# Patient Record
Sex: Female | Born: 1990 | Race: Black or African American | Hispanic: Yes | Marital: Single | Smoking: Never smoker
Health system: Southern US, Community
[De-identification: ages and names within clinical notes are randomized; demographics above are authoritative.]

## PROBLEM LIST (undated history)

## (undated) DIAGNOSIS — E119 Type 2 diabetes mellitus without complications: Secondary | ICD-10-CM

## (undated) DIAGNOSIS — B009 Herpesviral infection, unspecified: Secondary | ICD-10-CM

## (undated) DIAGNOSIS — L309 Dermatitis, unspecified: Secondary | ICD-10-CM

## (undated) DIAGNOSIS — T7840XA Allergy, unspecified, initial encounter: Secondary | ICD-10-CM

## (undated) DIAGNOSIS — R87629 Unspecified abnormal cytological findings in specimens from vagina: Secondary | ICD-10-CM

## (undated) DIAGNOSIS — O24419 Gestational diabetes mellitus in pregnancy, unspecified control: Secondary | ICD-10-CM

## (undated) DIAGNOSIS — D649 Anemia, unspecified: Secondary | ICD-10-CM

## (undated) DIAGNOSIS — R519 Headache, unspecified: Secondary | ICD-10-CM

## (undated) DIAGNOSIS — Z789 Other specified health status: Secondary | ICD-10-CM

## (undated) HISTORY — DX: Headache, unspecified: R51.9

## (undated) HISTORY — PX: DENTAL SURGERY: SHX609

## (undated) HISTORY — DX: Allergy, unspecified, initial encounter: T78.40XA

## (undated) HISTORY — DX: Dermatitis, unspecified: L30.9

## (undated) HISTORY — DX: Gestational diabetes mellitus in pregnancy, unspecified control: O24.419

## (undated) HISTORY — DX: Herpesviral infection, unspecified: B00.9

---

## 2017-04-20 ENCOUNTER — Ambulatory Visit (INDEPENDENT_AMBULATORY_CARE_PROVIDER_SITE_OTHER): Payer: Commercial Managed Care - HMO | Admitting: Physician Assistant

## 2017-04-20 ENCOUNTER — Ambulatory Visit (INDEPENDENT_AMBULATORY_CARE_PROVIDER_SITE_OTHER): Payer: Commercial Managed Care - HMO

## 2017-04-20 ENCOUNTER — Encounter: Payer: Self-pay | Admitting: Physician Assistant

## 2017-04-20 VITALS — BP 106/69 | HR 72 | Temp 98.1°F | Resp 18 | Ht 60.5 in | Wt 171.2 lb

## 2017-04-20 DIAGNOSIS — R202 Paresthesia of skin: Secondary | ICD-10-CM

## 2017-04-20 DIAGNOSIS — R531 Weakness: Secondary | ICD-10-CM | POA: Diagnosis not present

## 2017-04-20 DIAGNOSIS — M79602 Pain in left arm: Secondary | ICD-10-CM | POA: Diagnosis not present

## 2017-04-20 MED ORDER — MELOXICAM 15 MG PO TABS: 15.0000 mg | ORAL_TABLET | Freq: Every day | ORAL | 0 refills | Status: DC

## 2017-04-20 MED ORDER — CYCLOBENZAPRINE HCL 10 MG PO TABS: 5.0000 mg | ORAL_TABLET | Freq: Every day | ORAL | 0 refills | Status: DC

## 2017-04-20 NOTE — Progress Notes (Signed)
Patient ID: Tina Thornton, female     DOB: [birth year], 26 y.o.    MRN: [id number]  PCP: Patient, No Pcp Per  Chief Complaint  Patient presents with  . Arm Pain    Left arm injury, x 1week, [facility]    Subjective:   This patient is new to this practice and presents for evaluation of LEFT arm pain.  RIGHT hand dominant.  One week ago she was "pulling something." An empty metal can (usually contains mail), bumped the elbow repeatedly against another can. "I'm a very clumsy person." Initially it wasn't painful, but over the course of several hours, she developed a pain in the forearm (points to dorsal mid-shaft of the radius). Did not take anything to alleviate the symptoms.  Awoke on Saturday and the LEFT hand was swollen. Went to [facility] and was advised to ice it and prescribed naproxen. She requested an ACE wrap, which was placed on the forearm. The medication isn't working. [name] concerned that no radiographs were taken and that she may have a broken bone.  No previous injury to this arm. Hand remains mildly swollen.   Review of Systems As above.  Prior to Admission medications   Medication Sig Start Date End Date Taking? Authorizing Provider  naproxen (NAPROSYN) 500 MG tablet TK 1 T PO  BID 04/15/17   [provider]     No Known Allergies   There are no active problems to display for this patient.    No family history on file.   Social History   Social History  . Marital status: Single    Spouse name: n/a  . Number of children: 0  . Years of education: Bachelor's Degree   Occupational History  . Mail Tina Thornton     [employer]   Social History Main Topics  . Smoking status: Never Smoker  . Smokeless tobacco: Never Used     Comment: Hooka  . Alcohol use 0.0 oz/week  . Drug use: No  . Sexual activity: Yes    Partners: Male    Birth control/ protection: Pill     Comment: inconsistent condom use   Other Topics Concern  .  Not on file   Social History Narrative   Undergraduate degree in Psychology from [employer].   Lives alone.   Mother lives nearby.   One sister lives in [place], [place].   Father, brother and youngest sister live in [place].            Objective:  Physical Exam  Constitutional: [name] oriented to person, place, and time. She appears well-developed and well-nourished. [name] active and cooperative. No distress.  BP 106/69   Pulse 72   Temp 98.1 F (36.7 C) (Oral)   Resp 18   Ht 5' 0.5" (1.537 m)   Wt 171 lb 3.2 oz (77.7 kg)   LMP 03/25/2017   SpO2 98%   BMI 32.88 kg/m    Eyes: Conjunctivae are normal.  Pulmonary/Chest: Effort normal.  Musculoskeletal:       Left shoulder: Normal.       Left elbow: Normal.       Left wrist: Normal.       Cervical back: Normal.       Left upper arm: Normal.       Left forearm: She exhibits tenderness. She exhibits no bony tenderness, no swelling, no edema, no deformity and no laceration.  Arms:      Left hand: Decreased strength (can provide only minimal grasp on strength testing) noted.  Movement of the fingers and wrist causes pain into the forearm to the location the points to.  Neurological: [name] alert and oriented to person, place, and time. No sensory deficit.  Reduced strength in LEFT forearm and hand, but question effort. NEGATIVE Tinel's. POSITIVE Phalen's on LEFT.  Skin: Skin is warm, dry and intact. No lesion and no rash noted. No erythema.  Psychiatric: She has a normal mood and affect. Her speech is normal and behavior is normal.    Dg Forearm Left  Result Date: 04/20/2017 CLINICAL DATA:  Left forearm pain. EXAM: LEFT FOREARM - 2 VIEW COMPARISON:  None. FINDINGS: There is no evidence of fracture or other focal bone lesions. Soft tissues are unremarkable. IMPRESSION: Negative. Electronically Signed   By: Gerome Samavid  Williams III M.D   On: 04/20/2017 12:50      Assessment & Plan:  1. Left arm pain 2. Paresthesias in left hand 3.  Subjective weakness Unclear etiology, but most likely related to repetitive motion. Forearm muscle strain possible. Given paresthesias with Phalen's, employ wrist splint. Switch to meloxicam, which she can take QD, and at HS if it causes drowsiness. Cyclobenzaprine at HS. If not improved in 1-2 weeks, re-evaluate. No work restrictions recommended. - DG Forearm Left; Future - meloxicam (MOBIC) 15 MG tablet; Take 1 tablet (15 mg total) by mouth daily.  Dispense: 30 tablet; Refill: 0 - cyclobenzaprine (FLEXERIL) 10 MG tablet; Take 0.5-1 tablets (5-10 mg total) by mouth at bedtime.  Dispense: 15 tablet; Refill: 0 - Splint wrist   Return if symptoms worsen or fail to improve, and for wellness visit at your convenience.   Fernande Brashelle S. Misaki Sozio, PA-C Primary Care at [facility]

## 2017-04-20 NOTE — Patient Instructions (Addendum)
The radiographs are normal. You may have irritated the ulnar nerve by hitting the elbow repeatedly, but given that the elbow doesn't hurt, that is unlikely. You may have carpal tunnel syndrome. Wear the wrist splint as much as you can, though remove it for bathing. If it is problematic at work, at least wear it to bed. STOP the naproxen. Take meloxicam instead. Take the cyclobenzaprine (Flexeril) at bedtime, it causes sleepiness.    IF you received an x-ray today, you will receive an invoice from [facility]. Please contact [facility] at [phone] with questions or concerns regarding your invoice.   IF you received labwork today, you will receive an invoice from [facility]. Please contact [facility] at [phone] with questions or concerns regarding your invoice.   Our billing staff will not be able to assist you with questions regarding bills from these companies.  You will be contacted with the lab results as soon as they are available. The fastest way to get your results is to activate your My Chart account. Instructions are located on the last page of this paperwork. If you have not heard from us regarding the results in 2 weeks, please contact this office.     Carpal Tunnel Syndrome Carpal tunnel syndrome is a condition that causes pain in your hand and arm. The carpal tunnel is a narrow area that is on the palm side of your wrist. Repeated wrist motion or certain diseases may cause swelling in the tunnel. This swelling can pinch the main nerve in the wrist (median nerve). Follow these instructions at home: If you have a splint:   Wear it as told by your doctor. Remove it only as told by your doctor.  Loosen the splint if your fingers:  Become numb and tingle.  Turn blue and cold.  Keep the splint clean and dry. General instructions   Take over-the-counter and prescription medicines only as told by your doctor.  Rest your wrist from any activity that may  be causing your pain. If needed, talk to your employer about changes that can be made in your work, such as getting a wrist pad to use while typing.  If directed, apply ice to the painful area:  Put ice in a plastic bag.  Place a towel between your skin and the bag.  Leave the ice on for 20 minutes, 2-3 times per day.  Keep all follow-up visits as told by your doctor. This is important.  Do any exercises as told by your doctor, physical therapist, or occupational therapist. Contact a doctor if:  You have new symptoms.  Medicine does not help your pain.  Your symptoms get worse. This information is not intended to replace advice given to you by your health care provider. Make sure you discuss any questions you have with your health care provider. Document Released: 11/03/2011 Document Revised: 04/21/2016 Document Reviewed: 04/01/2015 Elsevier Interactive Patient Education  2017 [employer]

## 2017-06-25 ENCOUNTER — Emergency Department (HOSPITAL_COMMUNITY): Payer: Self-pay

## 2017-06-25 ENCOUNTER — Emergency Department (HOSPITAL_COMMUNITY)
Admission: EM | Admit: 2017-06-25 | Discharge: 2017-06-25 | Disposition: A | Discharge status: Home / Self Care | Payer: Self-pay | Attending: Emergency Medicine | Admitting: Emergency Medicine

## 2017-06-25 ENCOUNTER — Encounter (HOSPITAL_COMMUNITY): Payer: Self-pay | Admitting: Emergency Medicine

## 2017-06-25 DIAGNOSIS — Z79899 Other long term (current) drug therapy: Secondary | ICD-10-CM | POA: Insufficient documentation

## 2017-06-25 DIAGNOSIS — W228XXA Striking against or struck by other objects, initial encounter: Secondary | ICD-10-CM | POA: Insufficient documentation

## 2017-06-25 DIAGNOSIS — Y929 Unspecified place or not applicable: Secondary | ICD-10-CM | POA: Insufficient documentation

## 2017-06-25 DIAGNOSIS — S93401A Sprain of unspecified ligament of right ankle, initial encounter: Secondary | ICD-10-CM | POA: Insufficient documentation

## 2017-06-25 DIAGNOSIS — Y990 Civilian activity done for income or pay: Secondary | ICD-10-CM | POA: Insufficient documentation

## 2017-06-25 DIAGNOSIS — Y9389 Activity, other specified: Secondary | ICD-10-CM | POA: Insufficient documentation

## 2017-06-25 MED ORDER — IBUPROFEN 600 MG PO TABS: 600.0000 mg | ORAL_TABLET | Freq: Four times a day (QID) | ORAL | 0 refills | Status: DC | PRN

## 2017-06-25 MED ORDER — IBUPROFEN 400 MG PO TABS
800.0000 mg | ORAL_TABLET | Freq: Once | ORAL | Status: AC
  Administered 2017-06-25: 800 mg via ORAL
  Filled 2017-06-25: qty 2

## 2017-06-25 NOTE — ED Notes (Signed)
Pt verbalized understanding of d/c instructions and has no further questions. Pt is stable, A&Ox4, VSS.  

## 2017-06-25 NOTE — ED Triage Notes (Signed)
Pt st's she was pulling something at work and it hit his right ankle.  Pt c/o pain to right ankle

## 2017-06-25 NOTE — ED Notes (Signed)
Pt given ice for injury

## 2017-06-25 NOTE — ED Provider Notes (Signed)
[facility]-EMERGENCY DEPT Provider Note   CSN: [id number] Arrival date & time: 06/25/17  0011     History   Chief Complaint Chief Complaint  Patient presents with  . Ankle Injury    HPI Juanetta SnowJonahiry Flow is a 26 y.o. female.   Ankle Pain   The incident occurred 6 to 12 hours ago. The incident occurred at work. Injury mechanism: pulled heavy object, hitting ankle. The pain is present in the right ankle. The pain is mild. The pain has been constant since onset. Associated symptoms include tingling. Pertinent negatives include no inability to bear weight and no loss of sensation. The symptoms are aggravated by bearing weight. She has tried elevation for the symptoms. The treatment provided no relief.    History reviewed. No pertinent past medical history.  There are no active problems to display for this patient.   Past Surgical History:  Procedure Laterality Date  . DENTAL SURGERY      OB History    No data available       Home Medications    Prior to Admission medications   Medication Sig Start Date End Date Taking? Authorizing Provider  cyclobenzaprine (FLEXERIL) 10 MG tablet Take 0.5-1 tablets (5-10 mg total) by mouth at bedtime. 04/20/17   Porfirio OarJeffery, Chelle, PA-C  ibuprofen (ADVIL,MOTRIN) 600 MG tablet Take 1 tablet (600 mg total) by mouth every 6 (six) hours as needed. 06/25/17   Antony MaduraHumes, Latonga Ponder, PA-C  meloxicam (MOBIC) 15 MG tablet Take 1 tablet (15 mg total) by mouth daily. 04/20/17   Porfirio OarJeffery, Chelle, PA-C    Family History No family history on file.  Social History Social History  Substance Use Topics  . Smoking status: Never Smoker  . Smokeless tobacco: Never Used     Comment: Hooka  . Alcohol use 0.0 oz/week     Allergies   Patient has no known allergies.   Review of Systems Review of Systems  Musculoskeletal: Positive for arthralgias.  Neurological: Positive for tingling.  Ten systems reviewed and are negative for acute change, except as noted in the  HPI.    Physical Exam Updated Vital Signs BP 116/75 (BP Location: Right Arm)   Pulse 84   Temp 98.1 F (36.7 C) (Oral)   Resp 16   Ht 5' (1.524 m)   Wt 72.6 kg (160 lb)   LMP 06/11/2017 (Approximate)   SpO2 100%   BMI 31.25 kg/m   Physical Exam  Constitutional: [name] oriented to person, place, and time. She appears well-developed and well-nourished. No distress.  Nontoxic and in NAD  HENT:  Head: Normocephalic and atraumatic.  Eyes: Conjunctivae and EOM are normal. No scleral icterus.  Neck: Normal range of motion.  Cardiovascular: Normal rate, regular rhythm and intact distal pulses.   DP pulses 2+ in the right lower extremity  Pulmonary/Chest: Effort normal. No respiratory distress.  Musculoskeletal: Normal range of motion.  Tenderness to the lateral malleolus of the right ankle. No significant swelling. No pitting edema, deformity, or crepitus. Patient able to wiggle all toes.  Neurological: [name] alert and oriented to person, place, and time. She exhibits normal muscle tone. Coordination normal.  Sensation to light touch intact in the right lower extremity.  Skin: Skin is warm and dry. No rash noted. [name] not diaphoretic. No erythema. No pallor.  Psychiatric: She has a normal mood and affect. Her behavior is normal.  Nursing note and vitals reviewed.    ED Treatments / Results  Labs (all labs  ordered are listed, but only abnormal results are displayed) Labs Reviewed - No data to display  EKG  EKG Interpretation None       Radiology Dg Ankle Complete Right  Result Date: 06/25/2017 CLINICAL DATA:  Blunt trauma to the right ankle with persistent lateral pain EXAM: RIGHT ANKLE - COMPLETE 3+ VIEW COMPARISON:  None. FINDINGS: Negative for acute fracture or dislocation. The mortise is symmetric. Incidental plantar calcaneal spur. IMPRESSION: Negative for acute fracture Electronically Signed   By: Ellery Plunkaniel R Mitchell M.D.   On: 06/25/2017 01:34     Procedures Procedures (including critical care time)  Medications Ordered in ED Medications  ibuprofen (ADVIL,MOTRIN) tablet 800 mg (800 mg Oral Given 06/25/17 0151)     Initial Impression / Assessment and Plan / ED Course  I have reviewed the triage vital signs and the nursing notes.  Pertinent labs & imaging results that were available during my care of the patient were reviewed by me and considered in my medical decision making (see chart for details).     26 year old female presents for right ankle pain. [name] neurovascularly intact. No bony deformity or crepitus. X-ray negative for fracture or bony deformity. Symptoms consistent with ankle sprain. Will manage supportively with RICE and NSAIDs. Return precautions discussed and provided. Patient discharged in stable condition with no unaddressed concerns.   Final Clinical Impressions(s) / ED Diagnoses   Final diagnoses:  Sprain of right ankle, unspecified ligament, initial encounter    New Prescriptions New Prescriptions   IBUPROFEN (ADVIL,MOTRIN) 600 MG TABLET    Take 1 tablet (600 mg total) by mouth every 6 (six) hours as needed.     Antony MaduraHumes, Saxon Barich, PA-C 06/25/17 [id number]    Zadie RhineWickline, Donald, MD 06/26/17 [phone]

## 2017-06-28 ENCOUNTER — Ambulatory Visit (INDEPENDENT_AMBULATORY_CARE_PROVIDER_SITE_OTHER): Payer: Commercial Managed Care - HMO | Admitting: Urgent Care

## 2017-06-28 ENCOUNTER — Encounter: Payer: Self-pay | Admitting: Urgent Care

## 2017-06-28 VITALS — BP 103/72 | HR 99 | Temp 98.6°F | Resp 16 | Ht 61.0 in | Wt 168.0 lb

## 2017-06-28 DIAGNOSIS — S8991XA Unspecified injury of right lower leg, initial encounter: Secondary | ICD-10-CM

## 2017-06-28 DIAGNOSIS — S8011XA Contusion of right lower leg, initial encounter: Secondary | ICD-10-CM

## 2017-06-28 DIAGNOSIS — M79661 Pain in right lower leg: Secondary | ICD-10-CM | POA: Diagnosis not present

## 2017-06-28 MED ORDER — NAPROXEN SODIUM 550 MG PO TABS: 550.0000 mg | ORAL_TABLET | Freq: Two times a day (BID) | ORAL | 1 refills | Status: DC

## 2017-06-28 NOTE — Progress Notes (Signed)
  MRN: [id number] DOB: 03-Feb-1991  Subjective:   Tina Thornton is a 26 y.o. female presenting for chief complaint of ankle pain (right, [facility] at [facility] ED on 06/25/17, "a little better")  Patient suffered a right ankle injury on 06/24/2017. Patient was pulling a metal object that ended up colliding with the back of the right ankle. She was seen at the ER on 06/25/2017, imaging was negative. Today, she reports ongoing pain albeit slightly improved. Pain is achy, throbbing, radiates up to her calf, feels popping sensation, has intermittent numbness of her right toes. Still has swelling. She was advised to use ibuprofen but has not filled script.   Tina Thornton is not currently taking any medications. Also has No Known Allergies.  Tina Thornton denies past medical history. Also  has a past surgical history that includes Dental surgery.  Objective:   Vitals: BP 103/72   Pulse 99   Temp 98.6 F (37 C) (Oral)   Resp 16   Ht 5\' 1"  (1.549 m)   Wt 168 lb (76.2 kg)   LMP 06/06/2017   SpO2 98%   BMI 31.74 kg/m   Physical Exam  Constitutional: [name] oriented to person, place, and time. She appears well-developed and well-nourished.  Cardiovascular: Normal rate.   Pulmonary/Chest: Effort normal.  Musculoskeletal:       Right ankle: She exhibits decreased range of motion (active eversion and inversion of her foot secondary to pain; full passive ROM). She exhibits no swelling, no ecchymosis, no deformity, no laceration and normal pulse. Tenderness. Lateral malleolus (mild) and medial malleolus (mild) tenderness found. No AITFL, no CF ligament, no posterior TFL, no head of 5th metatarsal and no proximal fibula tenderness found. Achilles tendon exhibits no pain, no defect and normal Thompson's test results.       Right lower leg: She exhibits tenderness (over area depicted). She exhibits no bony tenderness, no swelling, no edema, no deformity and no laceration.       Legs: Neurological: [name] alert and  oriented to person, place, and time.   Assessment and Plan :   1. Right calf pain 2. Contusion of right calf, initial encounter 3. Injury of right lower leg, initial encounter - Will start Anaprox for patient. Advised to wear compression sleeve. Work restrictions provided for 1 week. Prop leg up at home for rest. Follow up in 1 week if symptoms persist.  Wallis BambergMario Morgana Rowley, PA-C Primary Care at [facility] [phone] 06/28/2017  3:05 PM

## 2017-06-28 NOTE — Patient Instructions (Addendum)
Pick up a compression sleeve for your calf. Use Anaprox for pain and inflammation. Prop your leg up at home. Come back in 1 week if your symptoms are persisting.   Contusion A contusion is a deep bruise. Contusions are the result of a blunt injury to tissues and muscle fibers under the skin. The injury causes bleeding under the skin. The skin overlying the contusion may turn blue, purple, or yellow. Minor injuries will give you a painless contusion, but more severe contusions may stay painful and swollen for a few weeks. What are the causes? This condition is usually caused by a blow, trauma, or direct force to an area of the body. What are the signs or symptoms? Symptoms of this condition include:  Swelling of the injured area.  Pain and tenderness in the injured area.  Discoloration. The area may have redness and then turn blue, purple, or yellow.  How is this diagnosed? This condition is diagnosed based on a physical exam and medical history. An X-ray, CT scan, or MRI may be needed to determine if there are any associated injuries, such as broken bones (fractures). How is this treated? Specific treatment for this condition depends on what area of the body was injured. In general, the best treatment for a contusion is resting, icing, applying pressure to (compression), and elevating the injured area. This is often called the RICE strategy. Over-the-counter anti-inflammatory medicines may also be recommended for pain control. Follow these instructions at home:  Rest the injured area.  If directed, apply ice to the injured area: ? Put ice in a plastic bag. ? Place a towel between your skin and the bag. ? Leave the ice on for 20 minutes, 2-3 times per day.  If directed, apply light compression to the injured area using an elastic bandage. Make sure the bandage is not wrapped too tightly. Remove and reapply the bandage as directed by your health care provider.  If possible, raise  (elevate) the injured area above the level of your heart while you are sitting or lying down.  Take over-the-counter and prescription medicines only as told by your health care provider. Contact a health care provider if:  Your symptoms do not improve after several days of treatment.  Your symptoms get worse.  You have difficulty moving the injured area. Get help right away if:  You have severe pain.  You have numbness in a hand or foot.  Your hand or foot turns pale or cold. This information is not intended to replace advice given to you by your health care provider. Make sure you discuss any questions you have with your health care provider. Document Released: 08/24/2005 Document Revised: 03/24/2016 Document Reviewed: 04/01/2015 Elsevier Interactive Patient Education  2017 [employer]     IF you received an x-ray today, you will receive an invoice from [facility]. Please contact [facility] at [phone] with questions or concerns regarding your invoice.   IF you received labwork today, you will receive an invoice from [facility]. Please contact [facility] at [phone] with questions or concerns regarding your invoice.   Our billing staff will not be able to assist you with questions regarding bills from these companies.  You will be contacted with the lab results as soon as they are available. The fastest way to get your results is to activate your My Chart account. Instructions are located on the last page of this paperwork. If you have not heard from us regarding the results in 2 weeks,  please contact this office.

## 2018-06-01 ENCOUNTER — Encounter (HOSPITAL_BASED_OUTPATIENT_CLINIC_OR_DEPARTMENT_OTHER): Payer: Self-pay

## 2018-06-01 ENCOUNTER — Emergency Department (HOSPITAL_BASED_OUTPATIENT_CLINIC_OR_DEPARTMENT_OTHER)
Admission: EM | Admit: 2018-06-01 | Discharge: 2018-06-01 | Disposition: A | Discharge status: Home / Self Care | Attending: Emergency Medicine | Admitting: Emergency Medicine

## 2018-06-01 ENCOUNTER — Emergency Department (HOSPITAL_BASED_OUTPATIENT_CLINIC_OR_DEPARTMENT_OTHER)

## 2018-06-01 ENCOUNTER — Other Ambulatory Visit: Payer: Self-pay

## 2018-06-01 DIAGNOSIS — Y9301 Activity, walking, marching and hiking: Secondary | ICD-10-CM | POA: Diagnosis not present

## 2018-06-01 DIAGNOSIS — Y999 Unspecified external cause status: Secondary | ICD-10-CM | POA: Diagnosis not present

## 2018-06-01 DIAGNOSIS — S99911A Unspecified injury of right ankle, initial encounter: Secondary | ICD-10-CM | POA: Diagnosis present

## 2018-06-01 DIAGNOSIS — S93491A Sprain of other ligament of right ankle, initial encounter: Secondary | ICD-10-CM | POA: Diagnosis not present

## 2018-06-01 DIAGNOSIS — X500XXA Overexertion from strenuous movement or load, initial encounter: Secondary | ICD-10-CM | POA: Diagnosis not present

## 2018-06-01 DIAGNOSIS — Y92480 Sidewalk as the place of occurrence of the external cause: Secondary | ICD-10-CM | POA: Diagnosis not present

## 2018-06-01 MED ORDER — ACETAMINOPHEN 500 MG PO TABS
1000.0000 mg | ORAL_TABLET | Freq: Once | ORAL | Status: AC
  Administered 2018-06-01: 1000 mg via ORAL
  Filled 2018-06-01: qty 2

## 2018-06-01 MED ORDER — IBUPROFEN 800 MG PO TABS
800.0000 mg | ORAL_TABLET | Freq: Once | ORAL | Status: AC
  Administered 2018-06-01: 800 mg via ORAL
  Filled 2018-06-01: qty 1

## 2018-06-01 NOTE — ED Notes (Signed)
When patient was discharged she decided she did not want the crutches that she has some at home. Patient already charged for crutches but did not take them with her.

## 2018-06-01 NOTE — ED Notes (Signed)
X-ray at bedside

## 2018-06-01 NOTE — Discharge Instructions (Signed)
Take 4 over the counter ibuprofen tablets 3 times a day or 2 over-the-counter naproxen tablets twice a day for pain. Also take tylenol 1000mg(2 extra strength) four times a day.    

## 2018-06-01 NOTE — ED Triage Notes (Signed)
Pt twisted her right ankle at work- heard a pop. Swelling noted. [facility] by [facility].

## 2018-06-01 NOTE — ED Provider Notes (Signed)
[facility] EMERGENCY DEPARTMENT Provider Note   CSN: [id number] Arrival date & time: 06/01/18  2236     History   Chief Complaint Chief Complaint  Patient presents with  . Ankle Pain    HPI Tina Thornton is a 27 y.o. female.  27 yo F with an inversion injury to the right ankle.  Stepped into a crack on the concrete.  Denies other injury.  Pain with bearing weight, movement, palpation.  Achy pain, denies radiation.  Denies pain at the knee.   The history is provided by the patient.  Ankle Pain   The incident occurred 1 to 2 hours ago. The incident occurred at work. Injury mechanism: inversion. The pain is present in the right ankle. The quality of the pain is described as sharp. The pain is at a severity of 2/10. The pain is mild. The pain has been constant since onset. Pertinent negatives include no numbness, no inability to bear weight, no loss of motion and no muscle weakness. She reports no foreign bodies present. Nothing aggravates the symptoms. She has tried nothing for the symptoms. The treatment provided no relief.    History reviewed. No pertinent past medical history.  Patient Active Problem List   Diagnosis Date Noted  . Right calf pain 06/28/2017    Past Surgical History:  Procedure Laterality Date  . DENTAL SURGERY       OB History   None      Home Medications    Prior to Admission medications   Medication Sig Start Date End Date Taking? Authorizing Provider  naproxen sodium (ANAPROX DS) 550 MG tablet Take 1 tablet (550 mg total) by mouth 2 (two) times daily with a meal. 06/28/17   Wallis BambergMani, Mario, PA-C    Family History No family history on file.  Social History Social History   Tobacco Use  . Smoking status: Never Smoker  . Smokeless tobacco: Never Used  . Tobacco comment: Hooka  Substance Use Topics  . Alcohol use: Yes    Alcohol/week: 0.0 oz  . Drug use: No     Allergies   Patient has no known allergies.   Review of  Systems Review of Systems  Constitutional: Negative for chills and fever.  HENT: Negative for congestion and rhinorrhea.   Eyes: Negative for redness and visual disturbance.  Respiratory: Negative for shortness of breath and wheezing.   Cardiovascular: Negative for chest pain and palpitations.  Gastrointestinal: Negative for nausea and vomiting.  Genitourinary: Negative for dysuria and urgency.  Musculoskeletal: Positive for arthralgias and myalgias.  Skin: Negative for pallor and wound.  Neurological: Negative for dizziness, numbness and headaches.     Physical Exam Updated Vital Signs BP 106/60   Temp 98.6 F (37 C) (Oral)   Resp 18   Ht 5' (1.524 m)   Wt 69.9 kg (154 lb)   LMP 04/30/2018   SpO2 99%   BMI 30.08 kg/m   Physical Exam  Constitutional: [name] oriented to person, place, and time. She appears well-developed and well-nourished. No distress.  HENT:  Head: Normocephalic and atraumatic.  Eyes: Pupils are equal, round, and reactive to light. EOM are normal.  Neck: Normal range of motion. Neck supple.  Cardiovascular: Normal rate and regular rhythm. Exam reveals no gallop and no friction rub.  No murmur heard. Pulmonary/Chest: Effort normal. She has no wheezes. She has no rales.  Abdominal: Soft. She exhibits no distension and no mass. There is no tenderness. There is no  guarding.  Musculoskeletal: She exhibits edema and tenderness.  Tenderness and edema to the R ankle, worst to the attachment of the ATF.  PMS intact distally. No pain at the fibular neck  Neurological: [name] alert and oriented to person, place, and time.  Skin: Skin is warm and dry. [name] not diaphoretic.  Psychiatric: She has a normal mood and affect. Her behavior is normal.  Nursing note and vitals reviewed.    ED Treatments / Results  Labs (all labs ordered are listed, but only abnormal results are displayed) Labs Reviewed  PREGNANCY, URINE    EKG None  Radiology Dg Ankle Complete  Right  Result Date: 06/01/2018 CLINICAL DATA:  Acute RIGHT ankle pain and swelling following injury. Initial encounter. EXAM: RIGHT ANKLE - COMPLETE 3+ VIEW COMPARISON:  06/25/2017 radiographs FINDINGS: An equivocal small avulsion fracture at the tip of the fibula noted. No other fracture, subluxation or dislocation identified. The joint space is unremarkable. A small calcaneal spur is again identified. IMPRESSION: Equivocal small fibular tip avulsion fracture Electronically Signed   By: Harmon Pier M.D.   On: 06/01/2018 22:57    Procedures Procedures (including critical care time)  Medications Ordered in ED Medications  acetaminophen (TYLENOL) tablet 1,000 mg (has no administration in time range)  ibuprofen (ADVIL,MOTRIN) tablet 800 mg (has no administration in time range)     Initial Impression / Assessment and Plan / ED Course  I have reviewed the triage vital signs and the nursing notes.  Pertinent labs & imaging results that were available during my care of the patient were reviewed by me and considered in my medical decision making (see chart for details).     27 yo F with an inversion ankle injury.  Pain at the ATF, xray with small avulsion fx consistent with strain as viewed by me.  Placed in ASO, crutches.  PCP follow up.   11:25 PM:  I have discussed the diagnosis/risks/treatment options with the patient and believe the pt to be eligible for discharge home to follow-up with PCP. We also discussed returning to the ED immediately if new or worsening sx occur. We discussed the sx which are most concerning (e.g., sudden worsening pain, fever, inability to tolerate by mouth) that necessitate immediate return. Medications administered to the patient during their visit and any new prescriptions provided to the patient are listed below.  Medications given during this visit Medications  acetaminophen (TYLENOL) tablet 1,000 mg (has no administration in time range)  ibuprofen  (ADVIL,MOTRIN) tablet 800 mg (has no administration in time range)     The patient appears reasonably screen and/or stabilized for discharge and I doubt any other medical condition or other [facility] requiring further screening, evaluation, or treatment in the ED at this time prior to discharge.    Final Clinical Impressions(s) / ED Diagnoses   Final diagnoses:  Sprain of anterior talofibular ligament of right ankle, initial encounter    ED Discharge Orders    None       Melene Plan, DO 06/01/18 2325

## 2018-06-28 ENCOUNTER — Encounter: Payer: Self-pay | Admitting: Family Medicine

## 2018-06-28 ENCOUNTER — Other Ambulatory Visit: Payer: Self-pay

## 2018-06-28 ENCOUNTER — Ambulatory Visit: Admitting: Family Medicine

## 2018-06-28 VITALS — BP 116/80 | HR 84 | Temp 98.6°F | Resp 17 | Ht 61.0 in | Wt 155.8 lb

## 2018-06-28 DIAGNOSIS — M79671 Pain in right foot: Secondary | ICD-10-CM

## 2018-06-28 DIAGNOSIS — S82891A Other fracture of right lower leg, initial encounter for closed fracture: Secondary | ICD-10-CM

## 2018-06-28 MED ORDER — IBUPROFEN 600 MG PO TABS: 600.0000 mg | ORAL_TABLET | Freq: Three times a day (TID) | ORAL | 0 refills | Status: DC | PRN

## 2018-06-28 NOTE — Patient Instructions (Addendum)
CLINICAL DATA:  Acute RIGHT ankle pain and swelling following injury. Initial encounter.  EXAM: RIGHT ANKLE - COMPLETE 3+ VIEW  COMPARISON:  06/25/2017 radiographs  FINDINGS: An equivocal small avulsion fracture at the tip of the fibula noted.  No other fracture, subluxation or dislocation identified.  The joint space is unremarkable.  A small calcaneal spur is again identified.  IMPRESSION: Equivocal small fibular tip avulsion fracture   Electronically Signed   By: Harmon PierJeffrey  Hu M.D.   On: 06/01/2018 22:57    IF you received an x-ray today, you will receive an invoice from [facility]. Please contact [facility] at [phone] with questions or concerns regarding your invoice.   IF you received labwork today, you will receive an invoice from [facility]. Please contact [facility] at [phone] with questions or concerns regarding your invoice.   Our billing staff will not be able to assist you with questions regarding bills from these companies.  You will be contacted with the lab results as soon as they are available. The fastest way to get your results is to activate your My Chart account. Instructions are located on the last page of this paperwork. If you have not heard from us regarding the results in 2 weeks, please contact this office.     Ankle Sprain An ankle sprain is a stretch or tear in one of the tough tissues (ligaments) in your ankle. Follow these instructions at home:  Rest your ankle.  Take over-the-counter and prescription medicines only as told by your doctor.  For 2-3 days, keep your ankle higher than the level of your heart (elevated) as much as possible.  If directed, put ice on the area: ? Put ice in a plastic bag. ? Place a towel between your skin and the bag. ? Leave the ice on for 20 minutes, 2-3 times a day.  If you were given a brace: ? Wear it as told. ? Take it off to shower or bathe. ? Try not to move your  ankle much, but wiggle your toes from time to time. This helps to prevent swelling.  If you were given an elastic bandage (dressing): ? Take it off when you shower or bathe. ? Try not to move your ankle much, but wiggle your toes from time to time. This helps to prevent swelling. ? Adjust the bandage to make it more comfortable if it feels too tight. ? Loosen the bandage if you lose feeling in your foot, your foot tingles, or your foot gets cold and blue.  If you have crutches, use them as told by your doctor. Continue to use them until you can walk without feeling pain in your ankle. Contact a doctor if:  Your bruises or swelling are quickly getting worse.  Your pain does not get better after you take medicine. Get help right away if:  You cannot feel your toes or foot.  Your toes or your foot looks blue.  You have very bad pain that gets worse. This information is not intended to replace advice given to you by your health care provider. Make sure you discuss any questions you have with your health care provider. Document Released: 05/02/2008 Document Revised: 04/21/2016 Document Reviewed: 06/16/2015 Elsevier Interactive Patient Education  [employer]2018 Elsevier Inc.

## 2018-06-28 NOTE — Progress Notes (Signed)
Chief Complaint  Patient presents with  . New Patient (Initial Visit)    w/c.  Seen in ED and told to see primary to f/u on ankle sprain.  Per pt some discomfort at the heel of foot but overall ankle better.  Per pt she has not seen a doctor since leaving [place] in 2012.    HPI   Pt reports that on 06/01/18 she injured her right ankle She states that she was operating equipment and when she stepped down she twisted her ankle on a crack in the floor and heard a pop. She was seen in the ER on 06/01/18 and was diagnosed with ankle sprain and advised an aircast and nsaids [name] on light duty at work. She returned 06/02/18 She reports that she has heel pain that is 4-5/10 She states that the pain is localized to the back of the heel.     CLINICAL DATA:  Acute RIGHT ankle pain and swelling following injury. Initial encounter.  EXAM: RIGHT ANKLE - COMPLETE 3+ VIEW  COMPARISON:  06/25/2017 radiographs  FINDINGS: An equivocal small avulsion fracture at the tip of the fibula noted.  No other fracture, subluxation or dislocation identified.  The joint space is unremarkable.  A small calcaneal spur is again identified.  IMPRESSION: Equivocal small fibular tip avulsion fracture   Electronically Signed   By: Harmon PierJeffrey  Hu M.D.   On: 06/01/2018 22:57  History reviewed. No pertinent past medical history.  Current Outpatient Medications  Medication Sig Dispense Refill  . ibuprofen (ADVIL,MOTRIN) 600 MG tablet Take 1 tablet (600 mg total) by mouth every 8 (eight) hours as needed. 30 tablet 0   No current facility-administered medications for this visit.     Allergies: No Known Allergies  Past Surgical History:  Procedure Laterality Date  . DENTAL SURGERY      Social History   Socioeconomic History  . Marital status: Single    Spouse name: n/a  . Number of children: 0  . Years of education: Bachelor's Degree  . Highest education level: Not on file  Occupational History    . 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 Financial resource strain: Not on file  . Food insecurity:    Worry: Not on file    Inability: Not on file  . Transportation needs:    Medical: Not on file    Non-medical: Not on file  Tobacco Use  . Smoking status: Never Smoker  . Smokeless tobacco: Never Used  . Tobacco comment: Hooka  Substance and Sexual Activity  . Alcohol use: Yes    Alcohol/week: 0.0 oz  . Drug use: No  . Sexual activity: Yes    Partners: Male    Birth control/protection: Pill    Comment: inconsistent condom use  Lifestyle  . Physical activity:    Days per week: Not on file    Minutes per session: Not on file  . Stress: Not on file  Relationships  . Social connections:    Talks on phone: Not on file    Gets together: Not on file    Attends religious service: Not on file    Active member of club or organization: Not on file    Attends meetings of clubs or organizations: Not on file    Relationship status: Not on file  Other Topics Concern  . Not on file  Social History Narrative   Undergraduate degree in Psychology from [employer].   Lives  alone.   Mother lives nearby.   One sister lives in [place], [place].   Father, brother and youngest sister live in [place].    Family History  Problem Relation Age of Onset  . Arthritis Paternal Grandmother      ROS Review of Systems See HPI Constitution: No fevers or chills No malaise No diaphoresis Skin: No rash or itching Eyes: no blurry vision, no double vision GU: no dysuria or hematuria Neuro: no dizziness or headaches  all others reviewed and negative   Objective: Vitals:   06/28/18 0910  BP: 116/80  Pulse: 84  Resp: 17  Temp: 98.6 F (37 C)  TempSrc: Oral  SpO2: 100%  Weight: 155 lb 12.8 oz (70.7 kg)  Height: 5\' 1"  (1.549 m)    Physical Exam  Constitutional: [name] oriented to person, place, and time. She appears well-developed and well-nourished.  HENT:  Head:  Normocephalic and atraumatic.  Eyes: Conjunctivae and EOM are normal.  Neck: Normal range of motion. No thyromegaly present.  Cardiovascular: Normal rate, regular rhythm and normal heart sounds.  No murmur heard. Pulmonary/Chest: Effort normal and breath sounds normal. No stridor. No respiratory distress.  Musculoskeletal:       Right ankle: She exhibits decreased range of motion. She exhibits no swelling, no ecchymosis, no deformity, no laceration and normal pulse. Tenderness. Proximal fibula tenderness found. No lateral malleolus, no medial malleolus, no AITFL, no CF ligament, no posterior TFL and no head of 5th metatarsal tenderness found. Achilles tendon exhibits no pain, no defect and normal Thompson's test results.  Neurological: [name] alert and oriented to person, place, and time.  Skin: Skin is warm. Capillary refill takes less than 2 seconds.  Psychiatric: She has a normal mood and affect. Her behavior is normal. Judgment and thought content normal.    Assessment and Plan Onell was seen today for new patient (initial visit).  Diagnoses and all orders for this visit:  Pain of right heel  Closed avulsion fracture of right ankle, initial encounter -     Ambulatory referral to Physical Therapy  Other orders -     ibuprofen (ADVIL,MOTRIN) 600 MG tablet; Take 1 tablet (600 mg total) by mouth every 8 (eight) hours as needed.  Pt evaluated Xrays reviewed with patient Will refer to PT Increase ibuprofen from 200mg  prn to 600mg  tid with food Follow up in 2 weeks    Paeton Latouche A Creta Levin

## 2018-06-29 ENCOUNTER — Telehealth: Payer: Self-pay | Admitting: Family Medicine

## 2018-06-29 NOTE — Telephone Encounter (Signed)
Copied from CRM [id number]. Topic: General - Other >> Jun 29, 2018  8:56 AM Tamela OddiHarris, Brenda J wrote: Reason for CRM: Patient called to request a letter stating that she is ready to resume full duties at work.  She states she is feeling much better and she needs a letter stating that she is clear to work full time.  Patient needs letter as soon as possible.  CB# [phone].

## 2018-07-09 NOTE — Telephone Encounter (Signed)
Spoke with patient she is going to come in on august 15 for her scheduled appointment for the note.

## 2018-07-12 ENCOUNTER — Ambulatory Visit: Admitting: Family Medicine

## 2018-07-12 ENCOUNTER — Other Ambulatory Visit: Payer: Self-pay

## 2018-07-12 ENCOUNTER — Encounter: Payer: Self-pay | Admitting: Family Medicine

## 2018-07-12 VITALS — BP 106/74 | HR 63 | Temp 97.8°F | Resp 16 | Ht 61.0 in | Wt 153.8 lb

## 2018-07-12 DIAGNOSIS — M79671 Pain in right foot: Secondary | ICD-10-CM

## 2018-07-12 NOTE — Progress Notes (Signed)
Chief Complaint  Patient presents with  . Work Related Injury    2 week f/u    HPI   Patient here to follow up on work related Date of injury 06/01/18 All symptoms resolved No further heel pain or calf pain No limping  She was doing light duty but would like to return to full duty.  [name] not requiring any iburpofen    No past medical history on file.  Current Outpatient Medications  Medication Sig Dispense Refill  . ibuprofen (ADVIL,MOTRIN) 600 MG tablet Take 1 tablet (600 mg total) by mouth every 8 (eight) hours as needed. 30 tablet 0   No current facility-administered medications for this visit.     Allergies: No Known Allergies  Past Surgical History:  Procedure Laterality Date  . DENTAL SURGERY      Social History   Socioeconomic History  . Marital status: Single    Spouse name: n/a  . Number of children: 0  . Years of education: Bachelor's Degree  . Highest education level: Not on file  Occupational History  . 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 Financial resource strain: Not on file  . Food insecurity:    Worry: Not on file    Inability: Not on file  . Transportation needs:    Medical: Not on file    Non-medical: Not on file  Tobacco Use  . Smoking status: Never Smoker  . Smokeless tobacco: Never Used  . Tobacco comment: Hooka  Substance and Sexual Activity  . Alcohol use: Yes    Alcohol/week: 0.0 standard drinks  . Drug use: No  . Sexual activity: Yes    Partners: Male    Birth control/protection: Pill    Comment: inconsistent condom use  Lifestyle  . Physical activity:    Days per week: Not on file    Minutes per session: Not on file  . Stress: Not on file  Relationships  . Social connections:    Talks on phone: Not on file    Gets together: Not on file    Attends religious service: Not on file    Active member of club or organization: Not on file    Attends meetings of clubs or organizations: Not on file   Relationship status: Not on file  Other Topics Concern  . Not on file  Social History Narrative   Undergraduate degree in Psychology from [employer].   Lives alone.   Mother lives nearby.   One sister lives in [place], [place].   Father, brother and youngest sister live in [place].    Family History  Problem Relation Age of Onset  . Arthritis Paternal Grandmother      ROS Review of Systems See HPI Constitution: No fevers or chills No malaise No diaphoresis Skin: No rash or itching Eyes: no blurry vision, no double vision GU: no dysuria or hematuria Neuro: no dizziness or headaches * all others reviewed and negative   Objective: Vitals:   07/12/18 1142  BP: 106/74  Pulse: 63  Resp: 16  Temp: 97.8 F (36.6 C)  TempSrc: Oral  SpO2: 100%  Weight: 153 lb 12.8 oz (69.8 kg)  Height: 5\' 1"  (1.549 m)   Physical Exam  Constitutional: She appears well-developed and well-nourished.  HENT:  Head: Normocephalic and atraumatic.  Eyes: Conjunctivae and EOM are normal.  Pulmonary/Chest: Effort normal.  Psychiatric: She has a normal mood and affect. Her behavior is normal. Judgment and thought  content normal.  Foot exam revealing normal exam of the heel and arch, non tender to palpation, no edema, normal gait, no achilles tenderness bilaterally  Assessment and Plan Kristol was seen today for work related injury.  Diagnoses and all orders for this visit:  Pain of right heel  -  Resolved. Return to work without restrictions. Letter given    Doristine BosworthZoe A Stallings

## 2018-07-12 NOTE — Patient Instructions (Signed)
  I will contact you with your lab results within the next 2 weeks.  If you have not heard from us then please contact us. The fastest way to get your results is to register for My Chart.   IF you received an x-ray today, you will receive an invoice from [facility]. Please contact [facility] at [phone] with questions or concerns regarding your invoice.   IF you received labwork today, you will receive an invoice from [facility]. Please contact [facility] at [phone] with questions or concerns regarding your invoice.   Our billing staff will not be able to assist you with questions regarding bills from these companies.  You will be contacted with the lab results as soon as they are available. The fastest way to get your results is to activate your My Chart account. Instructions are located on the last page of this paperwork. If you have not heard from us regarding the results in 2 weeks, please contact this office.     

## 2018-12-31 DIAGNOSIS — S339XXA Sprain of unspecified parts of lumbar spine and pelvis, initial encounter: Secondary | ICD-10-CM | POA: Diagnosis not present

## 2019-01-09 DIAGNOSIS — N925 Other specified irregular menstruation: Secondary | ICD-10-CM | POA: Diagnosis not present

## 2019-01-18 ENCOUNTER — Ambulatory Visit: Payer: Self-pay | Admitting: Emergency Medicine

## 2019-01-18 ENCOUNTER — Encounter

## 2019-01-21 ENCOUNTER — Ambulatory Visit: Payer: Self-pay | Admitting: Emergency Medicine

## 2019-06-20 DIAGNOSIS — Z20828 Contact with and (suspected) exposure to other viral communicable diseases: Secondary | ICD-10-CM | POA: Diagnosis not present

## 2019-06-25 ENCOUNTER — Other Ambulatory Visit: Payer: Self-pay

## 2019-06-25 ENCOUNTER — Ambulatory Visit: Payer: Federal, State, Local not specified - PPO | Admitting: Family Medicine

## 2019-06-25 VITALS — BP 108/77 | HR 107 | Temp 98.9°F | Ht 60.0 in | Wt 172.4 lb

## 2019-06-25 DIAGNOSIS — R739 Hyperglycemia, unspecified: Secondary | ICD-10-CM | POA: Diagnosis not present

## 2019-06-25 DIAGNOSIS — R51 Headache: Secondary | ICD-10-CM | POA: Diagnosis not present

## 2019-06-25 DIAGNOSIS — R519 Headache, unspecified: Secondary | ICD-10-CM

## 2019-06-25 NOTE — Progress Notes (Signed)
Headaches times 2 weeks intermittent either on one side of her head or the other, she has been taking Tylenol 500 mg headache will ease but next day the headache returns, also has been Ibuprofen with same results.

## 2019-06-25 NOTE — Patient Instructions (Signed)
Drink plenty of water-avoid caffeine Use a calendar to document-time of headache, type of headache, side of headache and other symptoms-ie. Dizziness, nausea, chest pain Document anything interesting-ie. Less sleep, early waking, odd diet , any alcohol use, any over the counter medications or vitamins. Document tylenol vs ibuprofen and amount needed to completely resolve the headache

## 2019-06-25 NOTE — Progress Notes (Signed)
Acute Office Visit  Subjective:    Patient ID: Tina Thornton, female    DOB: [birth year], 28 y.o.   MRN: [id number]  Chief Complaint  Patient presents with  . Headache    times 2 weeks    HPI Patient is in today for ongoing headaches. Pt states headaches noted over last few years-worse in last few months. Pt states pain onset after waking-headache does not wake pt from sleep. Pain Scale 8/10 with resolution if pt uses ibuprofen or tylenol completely until the next day.  Pt states no associated dizziness, nausea or vomiting. Pt with no known trigger. Pt with + FH of headaches.    PMH FH-+headaches Past Surgical History:  Procedure Laterality Date  . DENTAL SURGERY      Family History  Problem Relation Age of Onset  . Arthritis Paternal Grandmother     Social History   Socioeconomic History  . Marital status: Single    Spouse name: n/a  . Number of children: 0  . Years of education: Bachelor's Degree  . Highest education level: Not on file  Occupational History  . Occupation: Special educational needs teacher    Comment: [employer]  Social Needs  . Financial resource strain: Not on file  . Food insecurity    Worry: Not on file    Inability: Not on file  . Transportation needs    Medical: Not on file    Non-medical: Not on file  Tobacco Use  . Smoking status: Never Smoker  . Smokeless tobacco: Never Used  . Tobacco comment: Hooka  Substance and Sexual Activity  . Alcohol use: Yes    Alcohol/week: 0.0 standard drinks  . Drug use: No  . Sexual activity: Yes    Partners: Male    Birth control/protection: Pill    Comment: inconsistent condom use  Lifestyle  . Physical activity    Days per week: Not on file    Minutes per session: Not on file  . Stress: Not on file  Relationships  . Social Herbalist on phone: Not on file    Gets together: Not on file    Attends religious service: Not on file    Active member of club or organization: Not on file    Attends meetings of  clubs or organizations: Not on file    Relationship status: Not on file  . Intimate partner violence    Fear of current or ex partner: Not on file    Emotionally abused: Not on file    Physically abused: Not on file    Forced sexual activity: Not on file  Other Topics Concern  . Not on file  Social History Narrative   Undergraduate degree in Psychology from [employer].   Lives alone.   Mother lives nearby.   One sister lives in [place], [place].   Father, brother and youngest sister live in [place].    Outpatient Medications Prior to Visit  Medication Sig Dispense Refill  . ibuprofen (ADVIL,MOTRIN) 600 MG tablet Take 1 tablet (600 mg total) by mouth every 8 (eight) hours as needed. 30 tablet 0   No facility-administered medications prior to visit.     No Known Allergies  Review of Systems  Constitutional: Negative for chills, fever and malaise/fatigue.  HENT: Negative for congestion, ear pain and sore throat.   Eyes: Negative for blurred vision.  Respiratory: Positive for shortness of breath. Negative for cough.   Cardiovascular: Negative for chest pain.  Gastrointestinal: Negative for  abdominal pain and nausea.  Genitourinary: Negative for urgency.  Musculoskeletal: Positive for back pain.  Skin: Negative for rash.  Neurological: Positive for dizziness and headaches. Negative for loss of consciousness and weakness.       Objective:    Physical Exam  Constitutional: [name] oriented to person, place, and time. She appears well-developed and well-nourished. No distress.  HENT:  Head: Normocephalic and atraumatic.  Right Ear: External ear normal.  Left Ear: External ear normal.  Nose: Nose normal.  Mouth/Throat: Oropharynx is clear and moist. No oropharyngeal exudate.  Eyes: Pupils are equal, round, and reactive to light. Conjunctivae and EOM are normal.  Neck: Normal range of motion. Neck supple.  Cardiovascular: Normal rate, regular rhythm, normal heart sounds and intact distal  pulses.  Pulmonary/Chest: Effort normal and breath sounds normal.  Abdominal: Soft. Bowel sounds are normal.  Neurological: [name] alert and oriented to person, place, and time.  Psychiatric: She has a normal mood and affect.    BP 108/77 (BP Location: Left Arm, Patient Position: Sitting, Cuff Size: Normal)   Pulse (!) 107   Temp 98.9 F (37.2 C) (Oral)   Ht 5' (1.524 m)   Wt 172 lb 6.4 oz (78.2 kg)   SpO2 95%   BMI 33.67 kg/m  Wt Readings from Last 3 Encounters:  06/25/19 172 lb 6.4 oz (78.2 kg)  07/12/18 153 lb 12.8 oz (69.8 kg)  06/28/18 155 lb 12.8 oz (70.7 kg)    Health Maintenance Due  Topic Date Due  . HIV Screening  05/20/2005  . TETANUS/TDAP  05/20/2009  . PAP-Cervical Cytology Screening  05/21/2011  . PAP SMEAR-Modifier  06/29/2019        Assessment & Plan:   1. Chronic nonintractable headache, unspecified headache type No recent labwork per pt - CBC - TSH - CMP14+EGFR 7/29-pt [facility]-noted +COVID test-this was not disclosed by pt to staff of recent evaluation/testing)-elevated glucose and LFT-A1c pending Lavayah Vita Hannah Beat, MD

## 2019-06-26 ENCOUNTER — Telehealth: Payer: Self-pay | Admitting: Family Medicine

## 2019-06-26 ENCOUNTER — Telehealth: Payer: Self-pay

## 2019-06-26 LAB — CMP14+EGFR
ALT: 63 IU/L — ABNORMAL HIGH (ref 0–32)
AST: 44 IU/L — ABNORMAL HIGH (ref 0–40)
Albumin/Globulin Ratio: 1.5 (ref 1.2–2.2)
Albumin: 4.3 g/dL (ref 3.9–5.0)
Alkaline Phosphatase: 71 IU/L (ref 39–117)
BUN/Creatinine Ratio: 8 — ABNORMAL LOW (ref 9–23)
BUN: 7 mg/dL (ref 6–20)
Bilirubin Total: <0.2 mg/dL (ref 0.0–1.2)
CO2: 18 mmol/L — ABNORMAL LOW (ref 20–29)
Calcium: 9.1 mg/dL (ref 8.7–10.2)
Chloride: 107 mmol/L — ABNORMAL HIGH (ref 96–106)
Creatinine, Ser: 0.85 mg/dL (ref 0.57–1.00)
GFR calc Af Amer: 107 mL/min/{1.73_m2} (ref >59)
GFR calc non Af Amer: 93 mL/min/{1.73_m2} (ref >59)
Globulin, Total: 2.9 g/dL (ref 1.5–4.5)
Glucose: 182 mg/dL — ABNORMAL HIGH (ref 65–99)
Potassium: 4 mmol/L (ref 3.5–5.2)
Sodium: 141 mmol/L (ref 134–144)
Total Protein: 7.2 g/dL (ref 6.0–8.5)

## 2019-06-26 LAB — CBC
Hematocrit: 41.1 % (ref 34.0–46.6)
Hemoglobin: 13.2 g/dL (ref 11.1–15.9)
MCH: 28.4 pg (ref 26.6–33.0)
MCHC: 32.1 g/dL (ref 31.5–35.7)
MCV: 88 fL (ref 79–97)
Platelets: 273 10*3/uL (ref 150–450)
RBC: 4.65 x10E6/uL (ref 3.77–5.28)
RDW: 12.2 % (ref 11.7–15.4)
WBC: 4.4 10*3/uL (ref 3.4–10.8)

## 2019-06-26 LAB — TSH: TSH: 2.64 u[IU]/mL (ref 0.450–4.500)

## 2019-06-26 NOTE — Telephone Encounter (Signed)
Call to pt on behalf of Dr. Holly Bodily.  COVID+ results from [facility] noted.  Unaware at time of 07/28 OV that pt was being tested.  Pt unaware of results.  Advised result was positive.  Pt states she has been self-quarantined x 1 week.  Advised to continue.  Discussed elevated sugar in labs - limit sugar intake for time being.  Additional test being ordered.  Advised that liver enzymes also off. No alcohol and only take Tylenol as directed- no more than directed.  Do not take Ibuprofen while COVID+.  Pt denies need for work note.  States other than HA she is feeling ok.  Will keep 08/11 OV with NP but pt told to call clinic with any questions/concerns prior to OV or if she wants earlier appt.  Pt already has appt for repeat test next week.  Pt advised to go straight to ER with any difficulty breathing.  Pt denies other needs/questions.

## 2019-06-27 ENCOUNTER — Encounter: Payer: Self-pay | Admitting: Family Medicine

## 2019-06-27 DIAGNOSIS — G8929 Other chronic pain: Secondary | ICD-10-CM | POA: Insufficient documentation

## 2019-06-30 DIAGNOSIS — Z20828 Contact with and (suspected) exposure to other viral communicable diseases: Secondary | ICD-10-CM | POA: Diagnosis not present

## 2019-07-01 LAB — SPECIMEN STATUS REPORT

## 2019-07-01 LAB — HEMOGLOBIN A1C
Est. average glucose Bld gHb Est-mCnc: 117 mg/dL
Hgb A1c MFr Bld: 5.7 % — ABNORMAL HIGH (ref 4.8–5.6)

## 2019-07-03 ENCOUNTER — Encounter: Payer: Self-pay | Admitting: Family Medicine

## 2019-07-04 ENCOUNTER — Telehealth: Payer: Self-pay | Admitting: Family Medicine

## 2019-07-04 NOTE — Telephone Encounter (Signed)
Patient called in to check on status of work note. Please advise.

## 2019-07-04 NOTE — Telephone Encounter (Signed)
Copied from [place] [id number]. Topic: General - Inquiry >> Jul 03, 2019  3:04 PM Scherrie Gerlach wrote: Pt needs a return to work stating she is clear to return.  Pt tested positive for covid  on 7/23 but has been in quarantine.  Pt states she is not having any symptoms. Pt would like note uploaded to mychart.  Advised it may be a generic letter with CDC guidelines. Pt stated she wants to return tomorrow

## 2019-07-04 NOTE — Telephone Encounter (Signed)
Copied from CRM #[id number]. Topic: General - Inquiry °>> Jul 03, 2019  3:04 PM Harrald, Kathy J wrote: °Pt needs a return to work stating she is clear to return.  Pt tested positive for covid  on 7/23 but has been in quarantine.  Pt states she is not having any symptoms. °Pt would like note uploaded to mychart.  Advised it may be a generic letter with CDC guidelines. °Pt stated she wants to return tomorrow °

## 2019-07-05 NOTE — Telephone Encounter (Signed)
We are unable to provide a work note for this pt for she was not seen for covid with [place]. She was COVID pos on 06/20/19 and was quarantined. She now feels better and would like to return to work.   Since she has an appt on 07/09/19 with Orland Mustard and will follow up at that time. She did say she feels much better.   FYI to provider

## 2019-07-09 ENCOUNTER — Encounter: Payer: Self-pay | Admitting: Registered Nurse

## 2019-07-09 ENCOUNTER — Other Ambulatory Visit: Payer: Self-pay

## 2019-07-09 ENCOUNTER — Ambulatory Visit: Payer: Federal, State, Local not specified - PPO | Admitting: Registered Nurse

## 2019-07-09 VITALS — BP 112/76 | HR 105 | Temp 98.6°F | Resp 16 | Ht 61.42 in | Wt 174.0 lb

## 2019-07-09 DIAGNOSIS — R748 Abnormal levels of other serum enzymes: Secondary | ICD-10-CM | POA: Diagnosis not present

## 2019-07-09 DIAGNOSIS — K219 Gastro-esophageal reflux disease without esophagitis: Secondary | ICD-10-CM

## 2019-07-09 MED ORDER — OMEPRAZOLE 40 MG PO CPDR: 40.0000 mg | DELAYED_RELEASE_CAPSULE | Freq: Every day | ORAL | 3 refills | Status: DC

## 2019-07-09 NOTE — Patient Instructions (Signed)
° ° ° °  If you have lab work done today you will be contacted with your lab results within the next 2 weeks.  If you have not heard from us then please contact us. The fastest way to get your results is to register for My Chart. ° ° °IF you received an x-ray today, you will receive an invoice from [facility]. Please contact [facility] at [phone] with questions or concerns regarding your invoice.  ° °IF you received labwork today, you will receive an invoice from [facility]. Please contact [facility] at [phone] with questions or concerns regarding your invoice.  ° °Our billing staff will not be able to assist you with questions regarding bills from these companies. ° °You will be contacted with the lab results as soon as they are available. The fastest way to get your results is to activate your My Chart account. Instructions are located on the last page of this paperwork. If you have not heard from us regarding the results in 2 weeks, please contact this office. °  ° ° ° °

## 2019-07-09 NOTE — Progress Notes (Signed)
Established Patient Office Visit  Subjective:  Patient ID: Tina Thornton, female    DOB: 16-Jun-1991  Age: 28 y.o. MRN: [id number]  CC:  Chief Complaint  Patient presents with  . Headache    pt states she needs follow-up from COVID 19 and a release back to work. She states she is not currently having symptoms and the headachs are not present     HPI Tina Thornton presents for return to work note following COVID-19 infection.  Pt states her symptoms have resolved. She states that she still has baseline headaches, but otherwise denies SHOB, CP, dizziness, cough, fatigue.   Otherwise, she had elevated hepatic enzymes at last visit. We will recheck these today.  No other complaints.   History reviewed. No pertinent past medical history.  Past Surgical History:  Procedure Laterality Date  . DENTAL SURGERY      Family History  Problem Relation Age of Onset  . Arthritis Paternal Grandmother     Social History   Socioeconomic History  . Marital status: Single    Spouse name: n/a  . Number of children: 0  . Years of education: Bachelor's Degree  . Highest education level: Not on file  Occupational History  . 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 Financial resource strain: Not on file  . Food insecurity    Worry: Not on file    Inability: Not on file  . Transportation needs    Medical: Not on file    Non-medical: Not on file  Tobacco Use  . Smoking status: Never Smoker  . Smokeless tobacco: Never Used  . Tobacco comment: Hooka  Substance and Sexual Activity  . Alcohol use: Yes    Alcohol/week: 0.0 standard drinks  . Drug use: No  . Sexual activity: Yes    Partners: Male    Birth control/protection: Pill    Comment: inconsistent condom use  Lifestyle  . Physical activity    Days per week: Not on file    Minutes per session: Not on file  . Stress: Not on file  Relationships  . Social Musicianconnections    Talks on phone: Not on file   Gets together: Not on file    Attends religious service: Not on file    Active member of club or organization: Not on file    Attends meetings of clubs or organizations: Not on file    Relationship status: Not on file  . Intimate partner violence    Fear of current or ex partner: Not on file    Emotionally abused: Not on file    Physically abused: Not on file    Forced sexual activity: Not on file  Other Topics Concern  . Not on file  Social History Narrative   Undergraduate degree in Psychology from [employer].   Lives alone.   Mother lives nearby.   One sister lives in [place], [place].   Father, brother and youngest sister live in [place].    Outpatient Medications Prior to Visit  Medication Sig Dispense Refill  . ibuprofen (ADVIL,MOTRIN) 600 MG tablet Take 1 tablet (600 mg total) by mouth every 8 (eight) hours as needed. 30 tablet 0   No facility-administered medications prior to visit.     No Known Allergies  ROS Review of Systems  Constitutional: Negative.   HENT: Negative.   Eyes: Negative.   Respiratory: Negative.   Cardiovascular: Negative.   Gastrointestinal: Negative.   Endocrine: Negative.  Genitourinary: Negative.   Musculoskeletal: Negative.   Allergic/Immunologic: Negative.   Neurological: Positive for headaches (baseline, 2-3/10).  Hematological: Negative.   Psychiatric/Behavioral: Negative.   All other systems reviewed and are negative.     Objective:    Physical Exam  Constitutional: [name] oriented to person, place, and time. She appears well-developed and well-nourished. No distress.  Cardiovascular: Normal rate and regular rhythm.  Pulmonary/Chest: Effort normal. No respiratory distress.  Neurological: [name] alert and oriented to person, place, and time.  Skin: Skin is warm and dry. No rash noted. [name] not diaphoretic. No erythema. No pallor.  Psychiatric: She has a normal mood and affect. Her behavior is normal. Judgment and thought content normal.   Nursing note and vitals reviewed.   BP 112/76   Pulse (!) 105   Temp 98.6 F (37 C) (Oral)   Resp 16   Ht 5' 1.42" (1.56 m)   Wt 174 lb (78.9 kg)   LMP  (LMP Unknown)   SpO2 98%   BMI 32.43 kg/m  Wt Readings from Last 3 Encounters:  07/09/19 174 lb (78.9 kg)  06/25/19 172 lb 6.4 oz (78.2 kg)  07/12/18 153 lb 12.8 oz (69.8 kg)     Health Maintenance Due  Topic Date Due  . HIV Screening  05/20/2005  . TETANUS/TDAP  05/20/2009  . PAP-Cervical Cytology Screening  05/21/2011  . INFLUENZA VACCINE  06/29/2019  . PAP SMEAR-Modifier  06/29/2019    There are no preventive care reminders to display for this patient.  Lab Results  Component Value Date   TSH 2.640 06/25/2019   Lab Results  Component Value Date   WBC 4.4 06/25/2019   HGB 13.2 06/25/2019   HCT 41.1 06/25/2019   MCV 88 06/25/2019   PLT 273 06/25/2019   Lab Results  Component Value Date   NA 141 06/25/2019   K 4.0 06/25/2019   CO2 18 (L) 06/25/2019   GLUCOSE 182 (H) 06/25/2019   BUN 7 06/25/2019   CREATININE 0.85 06/25/2019   BILITOT <0.2 06/25/2019   ALKPHOS 71 06/25/2019   AST 44 (H) 06/25/2019   ALT 63 (H) 06/25/2019   PROT 7.2 06/25/2019   ALBUMIN 4.3 06/25/2019   CALCIUM 9.1 06/25/2019   No results found for: CHOL No results found for: HDL No results found for: LDLCALC No results found for: TRIG No results found for: CHOLHDL Lab Results  Component Value Date   HGBA1C 5.7 (H) 06/25/2019      Assessment & Plan:   Problem List Items Addressed This Visit    None    Visit Diagnoses    Elevated liver enzymes    -  Primary   Relevant Orders   Hepatic Function Panel   Gastroesophageal reflux disease, esophagitis presence not specified       Relevant Medications   omeprazole (PRILOSEC) 40 MG capsule      Meds ordered this encounter  Medications  . omeprazole (PRILOSEC) 40 MG capsule    Sig: Take 1 capsule (40 mg total) by mouth daily.    Dispense:  30 capsule    Refill:  3     Order Specific Question:   Supervising Provider    Answer:   Forrest Moron [id number]    Follow-up: Return if symptoms worsen or fail to improve.   PLAN  Hepatic enzymes drawn, will follow as warranted.  Omeprazole 40mg  PO qd sent for GERD  Otherwise, pt feels well. Return to work note  given  Patient encouraged to call clinic with any questions, comments, or concerns.   Tina Ageeichard Hedi Barkan, NP

## 2019-07-10 ENCOUNTER — Encounter: Payer: Self-pay | Admitting: Registered Nurse

## 2019-07-10 LAB — HEPATIC FUNCTION PANEL
ALT: 16 IU/L (ref 0–32)
AST: 14 IU/L (ref 0–40)
Albumin: 4.5 g/dL (ref 3.9–5.0)
Alkaline Phosphatase: 64 IU/L (ref 39–117)
Bilirubin Total: 0.3 mg/dL (ref 0.0–1.2)
Bilirubin, Direct: 0.09 mg/dL (ref 0.00–0.40)
Total Protein: 7.3 g/dL (ref 6.0–8.5)

## 2019-08-12 ENCOUNTER — Ambulatory Visit: Payer: Federal, State, Local not specified - PPO | Admitting: Registered Nurse

## 2019-08-12 ENCOUNTER — Encounter: Payer: Self-pay | Admitting: Registered Nurse

## 2019-08-12 ENCOUNTER — Other Ambulatory Visit: Payer: Self-pay

## 2019-08-12 VITALS — BP 108/74 | HR 83 | Temp 98.3°F | Resp 16 | Wt 174.0 lb

## 2019-08-12 DIAGNOSIS — R350 Frequency of micturition: Secondary | ICD-10-CM | POA: Diagnosis not present

## 2019-08-12 DIAGNOSIS — R748 Abnormal levels of other serum enzymes: Secondary | ICD-10-CM | POA: Diagnosis not present

## 2019-08-12 DIAGNOSIS — K921 Melena: Secondary | ICD-10-CM

## 2019-08-12 DIAGNOSIS — R079 Chest pain, unspecified: Secondary | ICD-10-CM

## 2019-08-12 LAB — POCT URINALYSIS DIP (MANUAL ENTRY)
Bilirubin, UA: NEGATIVE
Blood, UA: NEGATIVE
Glucose, UA: NEGATIVE mg/dL
Ketones, POC UA: NEGATIVE mg/dL
Leukocytes, UA: NEGATIVE
Nitrite, UA: NEGATIVE
Protein Ur, POC: NEGATIVE mg/dL
Spec Grav, UA: 1.025 (ref 1.010–1.025)
Urobilinogen, UA: 0.2 E.U./dL
pH, UA: 7 (ref 5.0–8.0)

## 2019-08-12 LAB — CMP14+EGFR
ALT: 14 IU/L (ref 0–32)
AST: 16 IU/L (ref 0–40)
Albumin/Globulin Ratio: 1.3 (ref 1.2–2.2)
Albumin: 3.7 g/dL — ABNORMAL LOW (ref 3.9–5.0)
Alkaline Phosphatase: 64 IU/L (ref 39–117)
BUN/Creatinine Ratio: 12 (ref 9–23)
BUN: 9 mg/dL (ref 6–20)
Bilirubin Total: 0.3 mg/dL (ref 0.0–1.2)
CO2: 20 mmol/L (ref 20–29)
Calcium: 9.1 mg/dL (ref 8.7–10.2)
Chloride: 106 mmol/L (ref 96–106)
Creatinine, Ser: 0.78 mg/dL (ref 0.57–1.00)
GFR calc Af Amer: 119 mL/min/{1.73_m2} (ref >59)
GFR calc non Af Amer: 103 mL/min/{1.73_m2} (ref >59)
Globulin, Total: 2.9 g/dL (ref 1.5–4.5)
Glucose: 105 mg/dL — ABNORMAL HIGH (ref 65–99)
Potassium: 4 mmol/L (ref 3.5–5.2)
Sodium: 139 mmol/L (ref 134–144)
Total Protein: 6.6 g/dL (ref 6.0–8.5)

## 2019-08-12 LAB — CBC WITH DIFFERENTIAL/PLATELET
Basophils Absolute: 0.1 10*3/uL (ref 0.0–0.2)
Basos: 1 %
EOS (ABSOLUTE): 0.1 10*3/uL (ref 0.0–0.4)
Eos: 1 %
Hematocrit: 39 % (ref 34.0–46.6)
Hemoglobin: 12.2 g/dL (ref 11.1–15.9)
Immature Grans (Abs): 0 10*3/uL (ref 0.0–0.1)
Immature Granulocytes: 0 %
Lymphocytes Absolute: 2.5 10*3/uL (ref 0.7–3.1)
Lymphs: 46 %
MCH: 27.5 pg (ref 26.6–33.0)
MCHC: 31.3 g/dL — ABNORMAL LOW (ref 31.5–35.7)
MCV: 88 fL (ref 79–97)
Monocytes Absolute: 0.5 10*3/uL (ref 0.1–0.9)
Monocytes: 10 %
Neutrophils Absolute: 2.3 10*3/uL (ref 1.4–7.0)
Neutrophils: 42 %
Platelets: 279 10*3/uL (ref 150–450)
RBC: 4.43 x10E6/uL (ref 3.77–5.28)
RDW: 12.5 % (ref 11.7–15.4)
WBC: 5.4 10*3/uL (ref 3.4–10.8)

## 2019-08-12 MED ORDER — HYDROCORTISONE (PERIANAL) 2.5 % EX CREA: 1.0000 "application " | TOPICAL_CREAM | Freq: Two times a day (BID) | CUTANEOUS | 0 refills | Status: DC

## 2019-08-12 NOTE — Progress Notes (Signed)
Established Patient Office Visit  Subjective:  Patient ID: Tina Thornton, female    DOB: 05/10/1991  Age: 28 y.o. MRN: [id number]  CC:  Chief Complaint  Patient presents with  . Blood In Stools    pt states she noticed blood in her stool on and off for 2 weeks   . Abdominal Pain    right side since last OV     HPI [place] presents for blood in stool and chest pain  Onset was 2 weeks prior to visit.  Blood in stool: scant, intermittent, bright red when wiping after BM. Has noticed increased frequency of loose stools. Denies change in color - no tarry, black stool, no melena, no constipation. No pain with defecation. No tenesmus.   Chest pain: intermittent stabbing pain midline just above epigastric region of abdomen. Has been seen for GERD in the past. Endorses dry feeling in esophagus and metallic taste in mouth. Denies headache, changes in vision, dependent edema, LOC, shob.   History reviewed. No pertinent past medical history.  Past Surgical History:  Procedure Laterality Date  . DENTAL SURGERY      Family History  Problem Relation Age of Onset  . Arthritis Paternal Grandmother     Social History   Socioeconomic History  . Marital status: Single    Spouse name: n/a  . Number of children: 0  . Years of education: Bachelor's Degree  . Highest education level: Not on file  Occupational History  . Occupation: Special educational needs teacher    Comment: [employer]  Social Needs  . Financial resource strain: Not on file  . Food insecurity    Worry: Not on file    Inability: Not on file  . Transportation needs    Medical: Not on file    Non-medical: Not on file  Tobacco Use  . Smoking status: Never Smoker  . Smokeless tobacco: Never Used  . Tobacco comment: Hooka  Substance and Sexual Activity  . Alcohol use: Yes    Alcohol/week: 0.0 standard drinks  . Drug use: No  . Sexual activity: Yes    Partners: Male    Birth control/protection: Pill    Comment: inconsistent  condom use  Lifestyle  . Physical activity    Days per week: Not on file    Minutes per session: Not on file  . Stress: Not on file  Relationships  . Social Herbalist on phone: Not on file    Gets together: Not on file    Attends religious service: Not on file    Active member of club or organization: Not on file    Attends meetings of clubs or organizations: Not on file    Relationship status: Not on file  . Intimate partner violence    Fear of current or ex partner: Not on file    Emotionally abused: Not on file    Physically abused: Not on file    Forced sexual activity: Not on file  Other Topics Concern  . Not on file  Social History Narrative   Undergraduate degree in Psychology from [employer].   Lives alone.   Mother lives nearby.   One sister lives in [place], [place].   Father, brother and youngest sister live in [place].    Outpatient Medications Prior to Visit  Medication Sig Dispense Refill  . omeprazole (PRILOSEC) 40 MG capsule Take 1 capsule (40 mg total) by mouth daily. 30 capsule 3   No facility-administered medications prior to  visit.     No Known Allergies  ROS Review of Systems  Constitutional: Negative.  Negative for chills, diaphoresis and fever.  HENT: Negative.   Eyes: Negative.  Negative for visual disturbance.  Respiratory: Negative.  Negative for cough, choking, chest tightness and shortness of breath.   Cardiovascular: Positive for chest pain. Negative for palpitations and leg swelling.  Gastrointestinal: Positive for anal bleeding and diarrhea. Negative for abdominal distention, abdominal pain, blood in stool, constipation, nausea, rectal pain and vomiting.  Endocrine: Negative.   Genitourinary: Negative.   Musculoskeletal: Negative.   Skin: Negative.   Allergic/Immunologic: Negative.   Neurological: Negative.  Negative for headaches.  Hematological: Negative.   Psychiatric/Behavioral: Negative.   All other systems reviewed and are  negative.     Objective:    Physical Exam  Constitutional: [name] oriented to person, place, and time. She appears well-developed and well-nourished. No distress.  Cardiovascular: Normal rate, regular rhythm and normal heart sounds.  Pulmonary/Chest: Effort normal and breath sounds normal. No respiratory distress.  Neurological: [name] alert and oriented to person, place, and time.  Skin: Skin is warm and dry. No rash noted. [name] not diaphoretic. No erythema. No pallor.  Psychiatric: She has a normal mood and affect. Her behavior is normal. Judgment and thought content normal.  Nursing note and vitals reviewed.   BP 108/74   Pulse 83   Temp 98.3 F (36.8 C) (Oral)   Resp 16   Wt 174 lb (78.9 kg)   LMP 07/26/2019 (Approximate)   SpO2 97%   BMI 32.43 kg/m  Wt Readings from Last 3 Encounters:  08/12/19 174 lb (78.9 kg)  07/09/19 174 lb (78.9 kg)  06/25/19 172 lb 6.4 oz (78.2 kg)     Health Maintenance Due  Topic Date Due  . HIV Screening  05/20/2005  . TETANUS/TDAP  05/20/2009  . PAP-Cervical Cytology Screening  05/21/2011  . INFLUENZA VACCINE  06/29/2019  . PAP SMEAR-Modifier  06/29/2019    There are no preventive care reminders to display for this patient.  Lab Results  Component Value Date   TSH 2.640 06/25/2019   Lab Results  Component Value Date   WBC 4.4 06/25/2019   HGB 13.2 06/25/2019   HCT 41.1 06/25/2019   MCV 88 06/25/2019   PLT 273 06/25/2019   Lab Results  Component Value Date   NA 141 06/25/2019   K 4.0 06/25/2019   CO2 18 (L) 06/25/2019   GLUCOSE 182 (H) 06/25/2019   BUN 7 06/25/2019   CREATININE 0.85 06/25/2019   BILITOT 0.3 07/09/2019   ALKPHOS 64 07/09/2019   AST 14 07/09/2019   ALT 16 07/09/2019   PROT 7.3 07/09/2019   ALBUMIN 4.5 07/09/2019   CALCIUM 9.1 06/25/2019   No results found for: CHOL No results found for: HDL No results found for: LDLCALC No results found for: TRIG No results found for: CHOLHDL Lab Results   Component Value Date   HGBA1C 5.7 (H) 06/25/2019      Assessment & Plan:   Problem List Items Addressed This Visit    None    Visit Diagnoses    Chest pain, unspecified type    -  Primary   Relevant Orders   EKG 12-Lead (Completed)   Blood in stool       Relevant Medications   hydrocortisone (ANUSOL-HC) 2.5 % rectal cream   Other Relevant Orders   CBC with Differential   Urinary frequency  Relevant Orders   POCT urinalysis dipstick (Completed)   Elevated liver enzymes       Relevant Orders   CMP14+EGFR      Meds ordered this encounter  Medications  . hydrocortisone (ANUSOL-HC) 2.5 % rectal cream    Sig: Place 1 application rectally 2 (two) times daily.    Dispense:  30 g    Refill:  0    Order Specific Question:   Supervising Provider    Answer:   Forrest Moron [id number]    Follow-up: No follow-ups on file.   PLAN  EKG and urine dip on site wnl  CBC and CMP sent out  Likely GERD and hemorrhoids, have sent Anusol, she already has omeprazole. Reviewed MOA of both and the benefit they will likely provide. Given normal EKG and exam, encouraged her to start omeprazole and anusol and return to clinic if symptoms persist 3-4 weeks  Patient encouraged to call clinic with any questions, comments, or concerns.   Maximiano Coss, NP

## 2019-08-12 NOTE — Patient Instructions (Signed)
° ° ° °  If you have lab work done today you will be contacted with your lab results within the next 2 weeks.  If you have not heard from us then please contact us. The fastest way to get your results is to register for My Chart. ° ° °IF you received an x-ray today, you will receive an invoice from [facility]. Please contact [facility] at [phone] with questions or concerns regarding your invoice.  ° °IF you received labwork today, you will receive an invoice from [facility]. Please contact [facility] at [phone] with questions or concerns regarding your invoice.  ° °Our billing staff will not be able to assist you with questions regarding bills from these companies. ° °You will be contacted with the lab results as soon as they are available. The fastest way to get your results is to activate your My Chart account. Instructions are located on the last page of this paperwork. If you have not heard from us regarding the results in 2 weeks, please contact this office. °  ° ° ° °

## 2019-08-13 ENCOUNTER — Encounter: Payer: Self-pay | Admitting: Registered Nurse

## 2019-08-26 DIAGNOSIS — R102 Pelvic and perineal pain: Secondary | ICD-10-CM | POA: Diagnosis not present

## 2019-08-27 DIAGNOSIS — R102 Pelvic and perineal pain: Secondary | ICD-10-CM | POA: Diagnosis not present

## 2019-08-29 DIAGNOSIS — R102 Pelvic and perineal pain: Secondary | ICD-10-CM | POA: Diagnosis not present

## 2019-08-29 DIAGNOSIS — Z01419 Encounter for gynecological examination (general) (routine) without abnormal findings: Secondary | ICD-10-CM | POA: Diagnosis not present

## 2019-08-29 DIAGNOSIS — Z124 Encounter for screening for malignant neoplasm of cervix: Secondary | ICD-10-CM | POA: Diagnosis not present

## 2019-08-29 DIAGNOSIS — Z6834 Body mass index (BMI) 34.0-34.9, adult: Secondary | ICD-10-CM | POA: Diagnosis not present

## 2019-08-29 DIAGNOSIS — Z113 Encounter for screening for infections with a predominantly sexual mode of transmission: Secondary | ICD-10-CM | POA: Diagnosis not present

## 2019-10-07 DIAGNOSIS — K080 Exfoliation of teeth due to systemic causes: Secondary | ICD-10-CM | POA: Diagnosis not present

## 2020-04-21 DIAGNOSIS — Z03818 Encounter for observation for suspected exposure to other biological agents ruled out: Secondary | ICD-10-CM | POA: Diagnosis not present

## 2020-04-21 DIAGNOSIS — Z20822 Contact with and (suspected) exposure to covid-19: Secondary | ICD-10-CM | POA: Diagnosis not present

## 2020-05-08 ENCOUNTER — Other Ambulatory Visit: Payer: Self-pay

## 2020-05-08 ENCOUNTER — Ambulatory Visit: Payer: Federal, State, Local not specified - PPO | Admitting: Family Medicine

## 2020-05-08 ENCOUNTER — Ambulatory Visit (INDEPENDENT_AMBULATORY_CARE_PROVIDER_SITE_OTHER): Payer: Federal, State, Local not specified - PPO

## 2020-05-08 ENCOUNTER — Encounter: Payer: Self-pay | Admitting: Family Medicine

## 2020-05-08 VITALS — BP 101/66 | HR 84 | Temp 97.9°F | Ht 60.0 in | Wt 163.0 lb

## 2020-05-08 DIAGNOSIS — M542 Cervicalgia: Secondary | ICD-10-CM

## 2020-05-08 DIAGNOSIS — M67431 Ganglion, right wrist: Secondary | ICD-10-CM | POA: Diagnosis not present

## 2020-05-08 DIAGNOSIS — S91301A Unspecified open wound, right foot, initial encounter: Secondary | ICD-10-CM | POA: Diagnosis not present

## 2020-05-08 DIAGNOSIS — S91309A Unspecified open wound, unspecified foot, initial encounter: Secondary | ICD-10-CM

## 2020-05-08 DIAGNOSIS — M25531 Pain in right wrist: Secondary | ICD-10-CM | POA: Diagnosis not present

## 2020-05-08 DIAGNOSIS — R2231 Localized swelling, mass and lump, right upper limb: Secondary | ICD-10-CM | POA: Diagnosis not present

## 2020-05-08 DIAGNOSIS — M7989 Other specified soft tissue disorders: Secondary | ICD-10-CM

## 2020-05-08 DIAGNOSIS — Z23 Encounter for immunization: Secondary | ICD-10-CM

## 2020-05-08 DIAGNOSIS — R200 Anesthesia of skin: Secondary | ICD-10-CM | POA: Diagnosis not present

## 2020-05-08 MED ORDER — MELOXICAM 7.5 MG PO TABS: 7.5000 mg | ORAL_TABLET | Freq: Every day | ORAL | 0 refills | Status: DC

## 2020-05-08 NOTE — Patient Instructions (Addendum)
  For wrist pain, I will check an x-ray, try using the wrist brace temporarily with range of motion at least a few times out of the that splint per day.  I will refer you to hand specialist and let you know if there are any concerns on x-ray.  Mobic once per day as needed.  Do not combine with other over-the-counter pain medicines.  I do not see any swelling in your legs today but if that returns please follow-up with Rich to discuss further.  Wound appears to be healing, if any swelling redness or worsening of that area please return for recheck.  I did update your tetanus today.  For neck pain and popping, try range of motion, gentle stretches throughout the day as we discussed but if that is not improving follow-up with Rich and may be able to check some x-rays or discuss it further.    If you have lab work done today you will be contacted with your lab results within the next 2 weeks.  If you have not heard from [place] then please contact us. The fastest way to get your results is to register for My Chart.   IF you received an x-ray today, you will receive an invoice from [facility]. Please contact [facility] at [phone] with questions or concerns regarding your invoice.   IF you received labwork today, you will receive an invoice from [facility]. Please contact [facility] at [phone] with questions or concerns regarding your invoice.   Our billing staff will not be able to assist you with questions regarding bills from these companies.  You will be contacted with the lab results as soon as they are available. The fastest way to get your results is to activate your My Chart account. Instructions are located on the last page of this paperwork. If you have not heard from [place] regarding the results in 2 weeks, please contact this office.

## 2020-05-08 NOTE — Progress Notes (Signed)
Subjective:  Patient ID: Tina Thornton, female    DOB: [birth year]  Age: 29 y.o. MRN: [id number]  CC:  Chief Complaint  Patient presents with  . Wrist Pain    pt has a cyst on her R wrist. pt states her wrist and fore arm hurt with movmement. 8/10 pain level currently.  . Edema    in both feet and ankels. pt reports no pain other than in the heal of her R foot. pt states she cut the bottom of her R foot on a rock on 04/28/2020. cut is starting to close but pain has moved to the heal of her foot.   . Neck Pain    pt reports that 3-4 months ago her neck poped and it was painful for about 2-3 mins then go away. pt states since then her neck will pop about 2-3x a month with the same pain for 2-3 mins.    HPI Tina Thornton presents for  Multiple concerns as above  Right wrist cyst, wrist and forearm discomfort Sore for few years. No prior eval.  Bump on top of wrist few years.  More sore past month, no injury, no change in activity. Fingers feel numb - 2-5th - comes and goes past month. Feels like swollen on R side underside.  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  Tx: brace, ice, tylenol - some relief.   Edema in both feet and ankles Noticed after traveling to [place] visiting grandmother , swollen in both feet only. No chest pain/dyspnea, no hx of CHF. Swelling in feet improved since returned 1 week ago  Wound on right foot, date of injury 04/28/2020. Getting in the water. Stepped on seaweed - cut bottom of foot on rock, scraped other areas- possible bleeding in water only.  Treated with otc ointment, then neosprin 1 week ago.  Cut is starting to close. Feels better - less sore. No discharge.   Unknown last tetanus.  Neck pain: Popping in neck few months ago, sore for few mins. Pops again few times per month with some soreness. No fall/injury. Notes with turning neck.  No arm weakness.  Tx: tylenol.  No current pain.     History Patient Active Problem List   Diagnosis Date Noted  . Elevated liver enzymes 08/12/2019  . Chronic nonintractable headache 06/27/2019  . Right calf pain 06/28/2017   No past medical history on file. Past Surgical History:  Procedure Laterality Date  . DENTAL SURGERY     No Known Allergies Prior to Admission medications   Medication Sig Start Date End Date Taking? Authorizing Provider  hydrocortisone (ANUSOL-HC) 2.5 % rectal cream Place 1 application rectally 2 (two) times daily. 08/12/19  Yes Janeece Agee, NP  omeprazole (PRILOSEC) 40 MG capsule Take 1 capsule (40 mg total) by mouth daily. 07/09/19  Yes Janeece Agee, NP   Social History   Socioeconomic History  . Marital status: Single    Spouse name: n/a  . Number of children: 0  . Years of education: Bachelor's Degree  . Highest education level: Not on file  Occupational History  . Occupation: Doctor, hospital    Comment: [employer]  Tobacco Use  . Smoking status: Never Smoker  . Smokeless tobacco: Never Used  . Tobacco comment: Hooka  Vaping Use  . Vaping Use: Never used  Substance and Sexual Activity  . Alcohol use: Yes    Alcohol/week: 0.0 standard drinks  . Drug use: No  .  Sexual activity: Yes    Partners: Male    Birth control/protection: Pill    Comment: inconsistent condom use  Other Topics Concern  . Not on file  Social History Narrative   Undergraduate degree in Psychology from [employer].   Lives alone.   Mother lives nearby.   One sister lives in [place], [place].   Father, brother and youngest sister live in [place].   Social Determinants of Health   Financial Resource Strain:   . Difficulty of Paying Living Expenses:   Food Insecurity:   . Worried About Charity fundraiser in the Last Year:   . Arboriculturist in the Last Year:   Transportation Needs:   . Film/video editor (Medical):   Marland Kitchen Lack of Transportation (Non-Medical):   Physical Activity:   . Days of Exercise per Week:   . Minutes  of Exercise per Session:   Stress:   . Feeling of Stress :   Social Connections:   . Frequency of Communication with Friends and Family:   . Frequency of Social Gatherings with Friends and Family:   . Attends Religious Services:   . Active Member of Clubs or Organizations:   . Attends Archivist Meetings:   Marland Kitchen Marital Status:   Intimate Partner Violence:   . Fear of Current or Ex-Partner:   . Emotionally Abused:   Marland Kitchen Physically Abused:   . Sexually Abused:     Review of Systems Per HPI   Objective:   Vitals:   05/08/20 1051  BP: 101/66  Pulse: 84  Temp: 97.9 F (36.6 C)  TempSrc: Temporal  SpO2: 97%  Weight: 163 lb (73.9 kg)  Height: 5' (1.524 m)     Physical Exam Constitutional:      General: [name] not in acute distress.    Appearance: [name] well-developed.  HENT:     Head: Normocephalic and atraumatic.  Cardiovascular:     Rate and Rhythm: Normal rate.  Pulmonary:     Effort: Pulmonary effort is normal.  Musculoskeletal:     Right lower leg: No edema.     Left lower leg: No edema.     Comments: C spine: Pain free rom, nontender.   Pain free, from of R shoulder and elbow.   R wrist: cystic area dorsal radial wrist, but nontender.  Guarded ulnar deviation, ttp distal radius/ulna.  Neg tinel, guarded phalen d/t pain. NVI distally to fingertips.   Skin:    General: Skin is warm and dry.     Comments: Healing abrasion on R 2nd toe, healing plantar wound, no induration/fluctuance. No erythema, nontender.   Neurological:     Mental Status: [name] alert and oriented to person, place, and time.         DG Wrist Complete Right  Result Date: 05/08/2020 CLINICAL DATA:  Bump on the right wrist for several years. Numbness in the index through little fingers. EXAM: RIGHT WRIST - COMPLETE 3+ VIEW COMPARISON:  None. FINDINGS: No acute bony or joint abnormality is identified. There is a small focus of prominence in the dorsal soft tissues of the wrist.  No soft tissue gas or radiopaque foreign body. IMPRESSION: Focus of soft tissue prominence on the dorsum of the wrist may represent the patient's palpated lesion. Finding is nonspecific. Negative for bony or joint abnormality. Electronically Signed   By: Inge Rise M.D.   On: 05/08/2020 12:10    Assessment & Plan:  Tina Thornton is  a 29 y.o. female . Right wrist pain - Plan: DG Wrist Complete Right, Ambulatory referral to Hand Surgery, Splint wrist, meloxicam (MOBIC) 7.5 MG tablet Ganglion cyst of wrist, right - Plan: DG Wrist Complete Right, Ambulatory referral to Hand Surgery  Ganglion cyst noted but not having discomfort in that area.  X-ray without focal bony findings.  Intermittent dysesthesias but negative Tinel's, guarded with Phalen testing.  Possible overuse given her type of work.  -Wrist splint prescribed, short-term meloxicam, refer to hand specialist.  Wound of foot - Plan: Tdap vaccine greater than or equal to 7yo IM Need for Tdap vaccination - Plan: Tdap vaccine greater than or equal to 7yo IM  -Wounds as above, appear to be healing, no signs of cellulitis/secondary infection.  Tdap updated.  RTC precautions  Neck pain  -Intermittent.  Reassuring exam at present.  Range of motion discussed with gentle stretches throughout her workday.  RTC precautions  Foot swelling  -Bilateral, now improving.  Potentially related to travel, but no calf swelling, no appreciable swelling on exam at this time.  RTC precautions  Meds ordered this encounter  Medications  . meloxicam (MOBIC) 7.5 MG tablet    Sig: Take 1 tablet (7.5 mg total) by mouth daily.    Dispense:  30 tablet    Refill:  0   Patient Instructions    For wrist pain, I will check an x-ray, try using the wrist brace temporarily with range of motion at least a few times out of the that splint per day.  I will refer you to hand specialist and let you know if there are any concerns on x-ray.  Mobic once per day as  needed.  Do not combine with other over-the-counter pain medicines.  I do not see any swelling in your legs today but if that returns please follow-up with Rich to discuss further.  Wound appears to be healing, if any swelling redness or worsening of that area please return for recheck.  I did update your tetanus today.  For neck pain and popping, try range of motion, gentle stretches throughout the day as we discussed but if that is not improving follow-up with Rich and may be able to check some x-rays or discuss it further.    If you have lab work done today you will be contacted with your lab results within the next 2 weeks.  If you have not heard from [place] then please contact us. The fastest way to get your results is to register for My Chart.   IF you received an x-ray today, you will receive an invoice from [facility]. Please contact [facility] at [phone] with questions or concerns regarding your invoice.   IF you received labwork today, you will receive an invoice from [facility]. Please contact [facility] at [phone] with questions or concerns regarding your invoice.   Our billing staff will not be able to assist you with questions regarding bills from these companies.  You will be contacted with the lab results as soon as they are available. The fastest way to get your results is to activate your My Chart account. Instructions are located on the last page of this paperwork. If you have not heard from [place] regarding the results in 2 weeks, please contact this office.         Signed, Meredith Staggers, MD [facility]

## 2020-05-14 ENCOUNTER — Encounter: Payer: Self-pay | Admitting: Orthopaedic Surgery

## 2020-05-14 ENCOUNTER — Ambulatory Visit: Payer: Federal, State, Local not specified - PPO | Admitting: Orthopaedic Surgery

## 2020-05-14 VITALS — Ht 60.0 in | Wt 160.0 lb

## 2020-05-14 DIAGNOSIS — M67431 Ganglion, right wrist: Secondary | ICD-10-CM

## 2020-05-14 NOTE — Progress Notes (Signed)
Office Visit Note   Patient: Tina Thornton           Date of Birth: Apr 10, 1991           MRN: [id number] Visit Date: 05/14/2020              Requested by: Wendie Agreste, MD [address] [place],  [place] [address] PCP: Maximiano Coss, NP   Assessment & Plan: Visit Diagnoses:  1. Ganglion cyst of dorsum of right wrist     Plan: Impression is symptomatic right dorsal wrist ganglion cyst.  Based on discussion of various treatment options that include needle aspiration and surgical excision, patient has elected for surgical excision.  Risk benefits and alternatives were discussed in detail.  All questions answered to her satisfaction.  We will plan to schedule her surgery for the near future.  Follow-Up Instructions: Return if symptoms worsen or fail to improve.   Orders:  No orders of the defined types were placed in this encounter.  No orders of the defined types were placed in this encounter.     Procedures: No procedures performed   Clinical Data: No additional findings.   Subjective: Chief Complaint  Patient presents with  . Right Wrist - Pain    Patient is a 29 year old female comes in for evaluation of of symptomatic right dorsal wrist ganglion cyst that has been there for years. Denies any injuries. She notices pain in this location which was not the case before. She has been wearing a wrist brace which does help somewhat. She takes meloxicam that was prescribed by PCP.   Review of Systems  Constitutional: Negative.   HENT: Negative.   Eyes: Negative.   Respiratory: Negative.   Cardiovascular: Negative.   Endocrine: Negative.   Musculoskeletal: Negative.   Neurological: Negative.   Hematological: Negative.   Psychiatric/Behavioral: Negative.   All other systems reviewed and are negative.    Objective: Vital Signs: Ht 5' (1.524 m)   Wt 160 lb (72.6 kg)   BMI 31.25 kg/m   Physical Exam Vitals and nursing note reviewed.  Constitutional:        Appearance: [name] well-developed.  HENT:     Head: Normocephalic and atraumatic.  Pulmonary:     Effort: Pulmonary effort is normal.  Abdominal:     Palpations: Abdomen is soft.  Musculoskeletal:     Cervical back: Neck supple.  Skin:    General: Skin is warm.     Capillary Refill: Capillary refill takes less than 2 seconds.  Neurological:     Mental Status: [name] alert and oriented to person, place, and time.  Psychiatric:        Behavior: Behavior normal.        Thought Content: Thought content normal.        Judgment: Judgment normal.     Ortho Exam Right wrist shows a palpable dorsal mass that is firm and semi mobile mass between EPL and ECRL.  Transillumates with light.  No aggressive features.   Specialty Comments:  No specialty comments available.  Imaging: No results found.   PMFS History: Patient Active Problem List   Diagnosis Date Noted  . Ganglion cyst of dorsum of right wrist 05/14/2020  . Elevated liver enzymes 08/12/2019  . Chronic nonintractable headache 06/27/2019  . Right calf pain 06/28/2017   History reviewed. No pertinent past medical history.  Family History  Problem Relation Age of Onset  . Arthritis Paternal Grandmother  Past Surgical History:  Procedure Laterality Date  . DENTAL SURGERY     Social History   Occupational History  . Occupation: Doctor, hospital    Comment: [employer]  Tobacco Use  . Smoking status: Never Smoker  . Smokeless tobacco: Never Used  . Tobacco comment: Hooka  Vaping Use  . Vaping Use: Never used  Substance and Sexual Activity  . Alcohol use: Yes    Alcohol/week: 0.0 standard drinks  . Drug use: No  . Sexual activity: Yes    Partners: Male    Birth control/protection: Pill    Comment: inconsistent condom use

## 2020-05-18 DIAGNOSIS — Z20822 Contact with and (suspected) exposure to covid-19: Secondary | ICD-10-CM | POA: Diagnosis not present

## 2020-06-23 ENCOUNTER — Ambulatory Visit: Payer: Federal, State, Local not specified - PPO | Admitting: Registered Nurse

## 2020-06-23 ENCOUNTER — Encounter: Payer: Self-pay | Admitting: Registered Nurse

## 2020-06-23 ENCOUNTER — Other Ambulatory Visit: Payer: Self-pay

## 2020-06-23 VITALS — BP 111/74 | HR 61 | Temp 97.9°F | Ht 60.0 in | Wt 163.2 lb

## 2020-06-23 DIAGNOSIS — R42 Dizziness and giddiness: Secondary | ICD-10-CM

## 2020-06-23 NOTE — Progress Notes (Signed)
Acute Office Visit  Subjective:    Patient ID: Tina Thornton, female    DOB: 1991/08/08, 29 y.o.   MRN: [id number]  Chief Complaint  Patient presents with  . Dizziness    Pt stated that she has been having some dizzy spells for the past 2weeks or more.and headache in the back of her head on the Lt side.    HPI Patient is in today for dizziness.  Onset two weeks ago. Having some headaches as well. Has had headaches in the past but these are feeling a little different than before. No falls or loss of balance No nvd No shob doe chest pain.  No other symptoms of note.   History reviewed. No pertinent past medical history.  Past Surgical History:  Procedure Laterality Date  . DENTAL SURGERY      Family History  Problem Relation Age of Onset  . Arthritis Paternal Grandmother     Social History   Socioeconomic History  . Marital status: Single    Spouse name: n/a  . Number of children: 0  . Years of education: Bachelor's Degree  . Highest education level: Not on file  Occupational History  . Occupation: Doctor, hospital    Comment: [employer]  Tobacco Use  . Smoking status: Never Smoker  . Smokeless tobacco: Never Used  . Tobacco comment: Hooka  Vaping Use  . Vaping Use: Never used  Substance and Sexual Activity  . Alcohol use: Yes    Alcohol/week: 0.0 standard drinks  . Drug use: No  . Sexual activity: Yes    Partners: Male    Birth control/protection: Pill    Comment: inconsistent condom use  Other Topics Concern  . Not on file  Social History Narrative   Undergraduate degree in Psychology from [employer].   Lives alone.   Mother lives nearby.   One sister lives in [place], [place].   Father, brother and youngest sister live in [place].   Social Determinants of Health   Financial Resource Strain:   . Difficulty of Paying Living Expenses:   Food Insecurity:   . Worried About Programme researcher, broadcasting/film/video in the Last Year:   . Barista in the Last Year:   Transportation  Needs:   . Freight forwarder (Medical):   Marland Kitchen Lack of Transportation (Non-Medical):   Physical Activity:   . Days of Exercise per Week:   . Minutes of Exercise per Session:   Stress:   . Feeling of Stress :   Social Connections:   . Frequency of Communication with Friends and Family:   . Frequency of Social Gatherings with Friends and Family:   . Attends Religious Services:   . Active Member of Clubs or Organizations:   . Attends Banker Meetings:   Marland Kitchen Marital Status:   Intimate Partner Violence:   . Fear of Current or Ex-Partner:   . Emotionally Abused:   Marland Kitchen Physically Abused:   . Sexually Abused:     Outpatient Medications Prior to Visit  Medication Sig Dispense Refill  . hydrocortisone (ANUSOL-HC) 2.5 % rectal cream Place 1 application rectally 2 (two) times daily. 30 g 0  . meloxicam (MOBIC) 7.5 MG tablet Take 1 tablet (7.5 mg total) by mouth daily. 30 tablet 0  . omeprazole (PRILOSEC) 40 MG capsule Take 1 capsule (40 mg total) by mouth daily. 30 capsule 3   No facility-administered medications prior to visit.    No Known Allergies  Review of  Systems Pertinent negatives and positives per hpi      Objective:    Physical Exam Vitals and nursing note reviewed.  Constitutional:      General: [name] not in acute distress.    Appearance: Normal appearance. [name] obese. [name] not ill-appearing, toxic-appearing or diaphoretic.  Cardiovascular:     Rate and Rhythm: Normal rate and regular rhythm.     Heart sounds: Normal heart sounds. No murmur heard.  No friction rub. No gallop.   Pulmonary:     Effort: Pulmonary effort is normal. No respiratory distress.     Breath sounds: Normal breath sounds. No stridor. No wheezing, rhonchi or rales.  Chest:     Chest wall: No tenderness.  Skin:    General: Skin is warm and dry.     Capillary Refill: Capillary refill takes less than 2 seconds.  Neurological:     General: No focal deficit present.     Mental  Status: [name] alert and oriented to person, place, and time. Mental status is at baseline.     Cranial Nerves: No cranial nerve deficit.     Sensory: No sensory deficit.     Motor: No weakness.     Coordination: Coordination normal.     Gait: Gait normal.     Deep Tendon Reflexes: Reflexes normal.  Psychiatric:        Mood and Affect: Mood normal.        Behavior: Behavior normal.        Thought Content: Thought content normal.        Judgment: Judgment normal.     BP 111/74 (BP Location: Right Arm, Patient Position: Sitting, Cuff Size: Normal)   Pulse 61   Temp 97.9 F (36.6 C) (Temporal)   Ht 5' (1.524 m)   Wt 163 lb 3.2 oz (74 kg)   LMP 05/05/2020   SpO2 99%   BMI 31.87 kg/m  Wt Readings from Last 3 Encounters:  06/23/20 163 lb 3.2 oz (74 kg)  05/14/20 160 lb (72.6 kg)  05/08/20 163 lb (73.9 kg)    Health Maintenance Due  Topic Date Due  . PAP SMEAR-Modifier  06/29/2019    There are no preventive care reminders to display for this patient.   Lab Results  Component Value Date   TSH 2.640 06/25/2019   Lab Results  Component Value Date   WBC 5.4 08/12/2019   HGB 12.2 08/12/2019   HCT 39.0 08/12/2019   MCV 88 08/12/2019   PLT 279 08/12/2019   Lab Results  Component Value Date   NA 139 08/12/2019   K 4.0 08/12/2019   CO2 20 08/12/2019   GLUCOSE 105 (H) 08/12/2019   BUN 9 08/12/2019   CREATININE 0.78 08/12/2019   BILITOT 0.3 08/12/2019   ALKPHOS 64 08/12/2019   AST 16 08/12/2019   ALT 14 08/12/2019   PROT 6.6 08/12/2019   ALBUMIN 3.7 (L) 08/12/2019   CALCIUM 9.1 08/12/2019   No results found for: CHOL No results found for: HDL No results found for: LDLCALC No results found for: TRIG No results found for: CHOLHDL Lab Results  Component Value Date   HGBA1C 5.7 (H) 06/25/2019       Assessment & Plan:   Problem List Items Addressed This Visit    None    Visit Diagnoses    Dizziness    -  Primary   Relevant Orders   CBC With Differential     TSH  Comprehensive metabolic panel       No orders of the defined types were placed in this encounter.  PLAN  Exam unremarkable.  Labs drawn, will follow up as warranted  Possible BPPV? But favor hypothyroidism, anemia, or hypoglycemia. Discussed these with patient who voiced understanding  Patient encouraged to call clinic with any questions, comments, or concerns.  Janeece Agee, NP

## 2020-06-23 NOTE — Patient Instructions (Signed)
° ° ° °  If you have lab work done today you will be contacted with your lab results within the next 2 weeks.  If you have not heard from us then please contact us. The fastest way to get your results is to register for My Chart. ° ° °IF you received an x-ray today, you will receive an invoice from [facility]. Please contact [facility] at [phone] with questions or concerns regarding your invoice.  ° °IF you received labwork today, you will receive an invoice from [facility]. Please contact [facility] at [phone] with questions or concerns regarding your invoice.  ° °Our billing staff will not be able to assist you with questions regarding bills from these companies. ° °You will be contacted with the lab results as soon as they are available. The fastest way to get your results is to activate your My Chart account. Instructions are located on the last page of this paperwork. If you have not heard from us regarding the results in 2 weeks, please contact this office. °  ° ° ° °

## 2020-06-24 LAB — COMPREHENSIVE METABOLIC PANEL
ALT: 12 IU/L (ref 0–32)
AST: 15 IU/L (ref 0–40)
Albumin/Globulin Ratio: 1.3 (ref 1.2–2.2)
Albumin: 4.1 g/dL (ref 3.9–5.0)
Alkaline Phosphatase: 69 IU/L (ref 48–121)
BUN/Creatinine Ratio: 18 (ref 9–23)
BUN: 16 mg/dL (ref 6–20)
Bilirubin Total: 0.3 mg/dL (ref 0.0–1.2)
CO2: 21 mmol/L (ref 20–29)
Calcium: 9.1 mg/dL (ref 8.7–10.2)
Chloride: 107 mmol/L — ABNORMAL HIGH (ref 96–106)
Creatinine, Ser: 0.87 mg/dL (ref 0.57–1.00)
GFR calc Af Amer: 103 mL/min/{1.73_m2} (ref >59)
GFR calc non Af Amer: 90 mL/min/{1.73_m2} (ref >59)
Globulin, Total: 3.1 g/dL (ref 1.5–4.5)
Glucose: 106 mg/dL — ABNORMAL HIGH (ref 65–99)
Potassium: 4 mmol/L (ref 3.5–5.2)
Sodium: 140 mmol/L (ref 134–144)
Total Protein: 7.2 g/dL (ref 6.0–8.5)

## 2020-06-24 LAB — CBC WITH DIFFERENTIAL
Basophils Absolute: 0 10*3/uL (ref 0.0–0.2)
Basos: 1 %
EOS (ABSOLUTE): 0.1 10*3/uL (ref 0.0–0.4)
Eos: 1 %
Hematocrit: 35.6 % (ref 34.0–46.6)
Hemoglobin: 12 g/dL (ref 11.1–15.9)
Immature Grans (Abs): 0 10*3/uL (ref 0.0–0.1)
Immature Granulocytes: 0 %
Lymphocytes Absolute: 2.9 10*3/uL (ref 0.7–3.1)
Lymphs: 47 %
MCH: 29.3 pg (ref 26.6–33.0)
MCHC: 33.7 g/dL (ref 31.5–35.7)
MCV: 87 fL (ref 79–97)
Monocytes Absolute: 0.5 10*3/uL (ref 0.1–0.9)
Monocytes: 7 %
Neutrophils Absolute: 2.7 10*3/uL (ref 1.4–7.0)
Neutrophils: 44 %
RBC: 4.09 x10E6/uL (ref 3.77–5.28)
RDW: 12.2 % (ref 11.7–15.4)
WBC: 6.2 10*3/uL (ref 3.4–10.8)

## 2020-06-24 LAB — TSH: TSH: 5.26 u[IU]/mL — ABNORMAL HIGH (ref 0.450–4.500)

## 2020-06-25 ENCOUNTER — Encounter: Payer: Self-pay | Admitting: Registered Nurse

## 2020-06-25 ENCOUNTER — Inpatient Hospital Stay: Payer: Federal, State, Local not specified - PPO | Admitting: Orthopaedic Surgery

## 2020-06-29 ENCOUNTER — Encounter: Payer: Self-pay | Admitting: Registered Nurse

## 2020-06-29 DIAGNOSIS — R7989 Other specified abnormal findings of blood chemistry: Secondary | ICD-10-CM

## 2020-07-02 ENCOUNTER — Other Ambulatory Visit: Payer: Self-pay | Admitting: Family Medicine

## 2020-07-02 DIAGNOSIS — M25531 Pain in right wrist: Secondary | ICD-10-CM

## 2020-07-31 ENCOUNTER — Encounter: Payer: Self-pay | Admitting: Registered Nurse

## 2020-07-31 ENCOUNTER — Other Ambulatory Visit: Payer: Self-pay

## 2020-07-31 ENCOUNTER — Ambulatory Visit: Payer: Federal, State, Local not specified - PPO | Admitting: Registered Nurse

## 2020-07-31 VITALS — BP 112/72 | HR 68 | Temp 98.1°F | Resp 18 | Ht 60.0 in | Wt 164.2 lb

## 2020-07-31 DIAGNOSIS — R7989 Other specified abnormal findings of blood chemistry: Secondary | ICD-10-CM | POA: Diagnosis not present

## 2020-07-31 NOTE — Patient Instructions (Signed)
° ° ° °  If you have lab work done today you will be contacted with your lab results within the next 2 weeks.  If you have not heard from us then please contact us. The fastest way to get your results is to register for My Chart. ° ° °IF you received an x-ray today, you will receive an invoice from [facility]. Please contact  Radiology at [phone] with questions or concerns regarding your invoice.  ° °IF you received labwork today, you will receive an invoice from [facility]. Please contact [facility] at [phone] with questions or concerns regarding your invoice.  ° °Our billing staff will not be able to assist you with questions regarding bills from these companies. ° °You will be contacted with the lab results as soon as they are available. The fastest way to get your results is to activate your My Chart account. Instructions are located on the last page of this paperwork. If you have not heard from us regarding the results in 2 weeks, please contact this office. °  ° ° ° °

## 2020-07-31 NOTE — Progress Notes (Signed)
Established Patient Office Visit  Subjective:  Patient ID: Tina Thornton, female    DOB: [birth year]  Age: 29 y.o. MRN: [id number]  CC:  Chief Complaint  Patient presents with  . Follow-up    patient states she is here for a follow up for her Thyroid. Per patient she does not have any other questions    HPI Tina Thornton presents for TSH follow up   Noted to be mildly elevated at last visit in Late July.  No new symptoms or concerns Having some dry throat but no dysphagia or globus sensation. No change to appetite. Does seem to worsen when her GERD symptoms recur.   No other questions, comments, or concerns at this time.   No past medical history on file.  Past Surgical History:  Procedure Laterality Date  . DENTAL SURGERY      Family History  Problem Relation Age of Onset  . Arthritis Paternal Grandmother     Social History   Socioeconomic History  . Marital status: Single    Spouse name: n/a  . Number of children: 0  . Years of education: Bachelor's Degree  . Highest education level: Not on file  Occupational History  . Occupation: Doctor, hospital    Comment: [employer]  Tobacco Use  . Smoking status: Never Smoker  . Smokeless tobacco: Never Used  . Tobacco comment: Hooka  Vaping Use  . Vaping Use: Never used  Substance and Sexual Activity  . Alcohol use: Yes    Alcohol/week: 0.0 standard drinks  . Drug use: No  . Sexual activity: Yes    Partners: Male    Birth control/protection: Pill    Comment: inconsistent condom use  Other Topics Concern  . Not on file  Social History Narrative   Undergraduate degree in Psychology from [employer].   Lives alone.   Mother lives nearby.   One sister lives in [place], [place].   Father, brother and youngest sister live in [place].   Social Determinants of Health   Financial Resource Strain:   . Difficulty of Paying Living Expenses: Not on file  Food Insecurity:   . Worried About Programme researcher, broadcasting/film/video in the Last Year: Not on  file  . Ran Out of Food in the Last Year: Not on file  Transportation Needs:   . Lack of Transportation (Medical): Not on file  . Lack of Transportation (Non-Medical): Not on file  Physical Activity:   . Days of Exercise per Week: Not on file  . Minutes of Exercise per Session: Not on file  Stress:   . Feeling of Stress : Not on file  Social Connections:   . Frequency of Communication with Friends and Family: Not on file  . Frequency of Social Gatherings with Friends and Family: Not on file  . Attends Religious Services: Not on file  . Active Member of Clubs or Organizations: Not on file  . Attends Banker Meetings: Not on file  . Marital Status: Not on file  Intimate Partner Violence:   . Fear of Current or Ex-Partner: Not on file  . Emotionally Abused: Not on file  . Physically Abused: Not on file  . Sexually Abused: Not on file    Outpatient Medications Prior to Visit  Medication Sig Dispense Refill  . hydrocortisone (ANUSOL-HC) 2.5 % rectal cream Place 1 application rectally 2 (two) times daily. 30 g 0  . meloxicam (MOBIC) 7.5 MG tablet TAKE 1 TABLET BY MOUTH EVERY DAY 30  tablet 0  . omeprazole (PRILOSEC) 40 MG capsule Take 1 capsule (40 mg total) by mouth daily. 30 capsule 3   No facility-administered medications prior to visit.    No Known Allergies  ROS Review of Systems  Constitutional: Negative.   HENT: Negative.   Eyes: Negative.   Respiratory: Negative.   Cardiovascular: Negative.   Gastrointestinal: Negative.   Endocrine: Negative.   Genitourinary: Negative.   Musculoskeletal: Negative.   Skin: Negative.   Allergic/Immunologic: Negative.   Neurological: Negative.   Hematological: Negative.   Psychiatric/Behavioral: Negative.   All other systems reviewed and are negative.     Objective:    Physical Exam Vitals and nursing note reviewed.  Constitutional:      General: [name] not in acute distress.    Appearance: Normal appearance.  [name] obese. [name] not ill-appearing, toxic-appearing or diaphoretic.  Cardiovascular:     Rate and Rhythm: Normal rate and regular rhythm.  Pulmonary:     Effort: Pulmonary effort is normal. No respiratory distress.  Neurological:     General: No focal deficit present.     Mental Status: [name] alert and oriented to person, place, and time. Mental status is at baseline.  Psychiatric:        Mood and Affect: Mood normal.        Behavior: Behavior normal.        Thought Content: Thought content normal.        Judgment: Judgment normal.     BP 112/72   Pulse 68   Temp 98.1 F (36.7 C) (Temporal)   Resp 18   Ht 5' (1.524 m)   Wt 164 lb 3.2 oz (74.5 kg)   LMP 06/23/2020   SpO2 99%   BMI 32.07 kg/m  Wt Readings from Last 3 Encounters:  07/31/20 164 lb 3.2 oz (74.5 kg)  06/23/20 163 lb 3.2 oz (74 kg)  05/14/20 160 lb (72.6 kg)     There are no preventive care reminders to display for this patient.  There are no preventive care reminders to display for this patient.  Lab Results  Component Value Date   TSH 5.260 (H) 06/23/2020   Lab Results  Component Value Date   WBC 6.2 06/23/2020   HGB 12.0 06/23/2020   HCT 35.6 06/23/2020   MCV 87 06/23/2020   PLT 279 08/12/2019   Lab Results  Component Value Date   NA 140 06/23/2020   K 4.0 06/23/2020   CO2 21 06/23/2020   GLUCOSE 106 (H) 06/23/2020   BUN 16 06/23/2020   CREATININE 0.87 06/23/2020   BILITOT 0.3 06/23/2020   ALKPHOS 69 06/23/2020   AST 15 06/23/2020   ALT 12 06/23/2020   PROT 7.2 06/23/2020   ALBUMIN 4.1 06/23/2020   CALCIUM 9.1 06/23/2020   No results found for: CHOL No results found for: HDL No results found for: LDLCALC No results found for: TRIG No results found for: CHOLHDL Lab Results  Component Value Date   HGBA1C 5.7 (H) 06/25/2019      Assessment & Plan:   Problem List Items Addressed This Visit    None    Visit Diagnoses    Elevated TSH    -  Primary   Relevant Orders    Thyroid Panel With TSH      No orders of the defined types were placed in this encounter.   Follow-up: No follow-ups on file.   PLAN  Continue omeprazole for no shorter  than 2 weeks at a time.  Thyroid panel with tsh drawn - will follow up as warranted  Patient encouraged to call clinic with any questions, comments, or concerns.  Janeece Agee, NP

## 2020-08-01 LAB — THYROID PANEL WITH TSH
Free Thyroxine Index: 1.6 (ref 1.2–4.9)
T3 Uptake Ratio: 23 % — ABNORMAL LOW (ref 24–39)
T4, Total: 7 ug/dL (ref 4.5–12.0)
TSH: 2.52 u[IU]/mL (ref 0.450–4.500)

## 2020-08-06 ENCOUNTER — Ambulatory Visit: Payer: Federal, State, Local not specified - PPO | Admitting: Orthopaedic Surgery

## 2020-10-30 ENCOUNTER — Other Ambulatory Visit: Payer: Self-pay

## 2020-10-30 ENCOUNTER — Other Ambulatory Visit: Payer: Self-pay | Admitting: Registered Nurse

## 2020-10-30 ENCOUNTER — Encounter: Payer: Self-pay | Admitting: Registered Nurse

## 2020-10-30 ENCOUNTER — Telehealth (INDEPENDENT_AMBULATORY_CARE_PROVIDER_SITE_OTHER): Payer: Federal, State, Local not specified - PPO | Admitting: Registered Nurse

## 2020-10-30 DIAGNOSIS — G43709 Chronic migraine without aura, not intractable, without status migrainosus: Secondary | ICD-10-CM | POA: Diagnosis not present

## 2020-10-30 DIAGNOSIS — K219 Gastro-esophageal reflux disease without esophagitis: Secondary | ICD-10-CM | POA: Diagnosis not present

## 2020-10-30 DIAGNOSIS — G8929 Other chronic pain: Secondary | ICD-10-CM

## 2020-10-30 DIAGNOSIS — R0981 Nasal congestion: Secondary | ICD-10-CM | POA: Diagnosis not present

## 2020-10-30 MED ORDER — BUTALBITAL-APAP-CAFFEINE 50-325-40 MG PO TABS: 1.0000 | ORAL_TABLET | Freq: Four times a day (QID) | ORAL | 0 refills | Status: DC | PRN

## 2020-10-30 MED ORDER — FLUTICASONE PROPIONATE 50 MCG/ACT NA SUSP: 2.0000 | Freq: Every day | NASAL | 6 refills | Status: DC

## 2020-10-30 MED ORDER — AZELASTINE HCL 0.1 % NA SOLN: 1.0000 | Freq: Two times a day (BID) | NASAL | 12 refills | Status: DC

## 2020-10-30 MED ORDER — OMEPRAZOLE 40 MG PO CPDR: 40.0000 mg | DELAYED_RELEASE_CAPSULE | Freq: Every day | ORAL | 3 refills | Status: DC

## 2020-10-30 NOTE — Patient Instructions (Signed)
° ° ° °  If you have lab work done today you will be contacted with your lab results within the next 2 weeks.  If you have not heard from us then please contact us. The fastest way to get your results is to register for My Chart. ° ° °IF you received an x-ray today, you will receive an invoice from [facility]. Please contact [facility] at [phone] with questions or concerns regarding your invoice.  ° °IF you received labwork today, you will receive an invoice from [facility]. Please contact [facility] at [phone] with questions or concerns regarding your invoice.  ° °Our billing staff will not be able to assist you with questions regarding bills from these companies. ° °You will be contacted with the lab results as soon as they are available. The fastest way to get your results is to activate your My Chart account. Instructions are located on the last page of this paperwork. If you have not heard from us regarding the results in 2 weeks, please contact this office. °  ° ° ° °

## 2020-10-30 NOTE — Progress Notes (Signed)
Telemedicine Encounter- SOAP NOTE Established Patient  This telephone encounter was conducted with the patient's (or proxy's) verbal consent via audio telecommunications: yes  Patient was instructed to have this encounter in a suitably private space; and to only have persons present to whom they give permission to participate. In addition, patient identity was confirmed by use of name plus two identifiers (DOB and address).  I discussed the limitations, risks, security and privacy concerns of performing an evaluation and management service by telephone and the availability of in person appointments. I also discussed with the patient that there may be a patient responsible charge related to this service. The patient expressed understanding and agreed to proceed.  I spent a total of 15 minutes talking with the patient or their proxy.  Patient at home Provider in office  Chief Complaint  Patient presents with  . Nasal Congestion    Patient states she has been having a headache for two weeks. Patient states she has been having a dry throat and cold. Per patient she has been taking OTC for cold and also tylenol.    Subjective   Tina Thornton is a 29 y.o. established patient. Telephone visit today for nasal congestion  HPI Nasal congestion: onset two weeks ago. Improving. Using OTCs with some relief. Does not feel there is pnd. No sinus pressure or pain. Question of hx of allergies.  Headache: more frequent, more severe. Missed work one day in last two weeks with severe unilateral headache R side that affected her L eye - felt weak like she couldn't open it. Denies blurred vision or aura. Headache resolved on its own. Hx of chronic headache but this has been worse. Would like to see neurologist.   Dry throat: scratchy, irritated. Has been out of omeprazole for some time. Does not feel there is pnd or chest congestion. No dysphagia. Has tried hydration and otcs with no relief. May be  improving.   No sick contacts  Had covid mid 2020 and has been vaccinated, second dose 04/19/20  Patient Active Problem List   Diagnosis Date Noted  . Ganglion cyst of dorsum of right wrist 05/14/2020  . Elevated liver enzymes 08/12/2019  . Chronic nonintractable headache 06/27/2019  . Right calf pain 06/28/2017    No past medical history on file.  Current Outpatient Medications  Medication Sig Dispense Refill  . hydrocortisone (ANUSOL-HC) 2.5 % rectal cream Place 1 application rectally 2 (two) times daily. 30 g 0  . omeprazole (PRILOSEC) 40 MG capsule Take 1 capsule (40 mg total) by mouth daily. 30 capsule 3  . azelastine (ASTELIN) 0.1 % nasal spray Place 1 spray into both nostrils 2 (two) times daily. Use in each nostril as directed 30 mL 12  . butalbital-acetaminophen-caffeine (FIORICET) 50-325-40 MG tablet Take 1-2 tablets by mouth every 6 (six) hours as needed for headache. 20 tablet 0  . fluticasone (FLONASE) 50 MCG/ACT nasal spray Place 2 sprays into both nostrils daily. 16 g 6   No current facility-administered medications for this visit.    No Known Allergies  Social History   Socioeconomic History  . Marital status: Single    Spouse name: n/a  . Number of children: 0  . Years of education: Bachelor's Degree  . Highest education level: Not on file  Occupational History  . Occupation: Doctor, hospital    Comment: [employer]  Tobacco Use  . Smoking status: Never Smoker  . Smokeless tobacco: Never Used  . Tobacco comment: Hooka  Vaping Use  . Vaping Use: Never used  Substance and Sexual Activity  . Alcohol use: Yes    Alcohol/week: 0.0 standard drinks  . Drug use: No  . Sexual activity: Yes    Partners: Male    Birth control/protection: Pill    Comment: inconsistent condom use  Other Topics Concern  . Not on file  Social History Narrative   Undergraduate degree in Psychology from [employer].   Lives alone.   Mother lives nearby.   One sister lives in [place], [place].    Father, brother and youngest sister live in [place].   Social Determinants of Health   Financial Resource Strain:   . Difficulty of Paying Living Expenses: Not on file  Food Insecurity:   . Worried About Programme researcher, broadcasting/film/video in the Last Year: Not on file  . Ran Out of Food in the Last Year: Not on file  Transportation Needs:   . Lack of Transportation (Medical): Not on file  . Lack of Transportation (Non-Medical): Not on file  Physical Activity:   . Days of Exercise per Week: Not on file  . Minutes of Exercise per Session: Not on file  Stress:   . Feeling of Stress : Not on file  Social Connections:   . Frequency of Communication with Friends and Family: Not on file  . Frequency of Social Gatherings with Friends and Family: Not on file  . Attends Religious Services: Not on file  . Active Member of Clubs or Organizations: Not on file  . Attends Banker Meetings: Not on file  . Marital Status: Not on file  Intimate Partner Violence:   . Fear of Current or Ex-Partner: Not on file  . Emotionally Abused: Not on file  . Physically Abused: Not on file  . Sexually Abused: Not on file    ROS Per hpi  Objective   Vitals as reported by the patient: There were no vitals filed for this visit.  [name] was seen today for nasal congestion.  Diagnoses and all orders for this visit:  Chronic migraine without aura without status migrainosus, not intractable -     butalbital-acetaminophen-caffeine (FIORICET) 50-325-40 MG tablet; Take 1-2 tablets by mouth every 6 (six) hours as needed for headache.  Gastroesophageal reflux disease, unspecified whether esophagitis present -     omeprazole (PRILOSEC) 40 MG capsule; Take 1 capsule (40 mg total) by mouth daily.  Nasal congestion -     fluticasone (FLONASE) 50 MCG/ACT nasal spray; Place 2 sprays into both nostrils daily. -     azelastine (ASTELIN) 0.1 % nasal spray; Place 1 spray into both nostrils 2 (two) times daily. Use in each  nostril as directed   PLAN  Refill omeprazole - GERD may be contributing to dry throat. Discussed nonpharm including lozenges and avoiding triggers  flonase and azelastine for nasal congestion. With improvement I am doubting bacterial etiology. Suspect viral or allergies.   Headaches: concern for worsening and concern for ocular symptoms - will refer to neuro. Will try fioricet for relief in interim. Discussed red flags with patient in depth. Pt voices understanding  Patient encouraged to call clinic with any questions, comments, or concerns.   I discussed the assessment and treatment plan with the patient. The patient was provided an opportunity to ask questions and all were answered. The patient agreed with the plan and demonstrated an understanding of the instructions.   The patient was advised to call back or seek an in-person evaluation  if the symptoms worsen or if the condition fails to improve as anticipated.  I provided 15 minutes of non-face-to-face time during this encounter.  Maximiano Coss, NP  Primary Care at [facility]

## 2020-11-28 NOTE — L&D Delivery Note (Signed)
Delivery Note Labor onset:  11/24/21 Labor Onset Time: 1510 FHR second stage Cat 2 Analgesia/Anesthesia intrapartum: epidural  Guided pushing with maternal urge. Delivery of a viable female at 2222. Fetal head delivered in LOA position.  Nuchal cord: 1.  Infant placed on maternal abd, dried, and tactile stim. Spontanous cry. Cord double clamped after 1 min and cut by Grandmother of baby.  3 Rns present for birth.  Cord blood sample collected: yes Arterial cord blood sample collected: N/A  Placenta delivered Tomasa Blase via Binnie Kand maneuver, intact, with 3 VC.  AMTSL Placenta to L&D. Uterine tone firm, bleeding moderate  2nd degree laceration identified.  Anesthesia: epidural Repair: 3-0 vicryl  QBL/EBL (mL): 200 Complications: Nuchal cord APGAR: APGAR (1 MIN): 7   APGAR (5 MINS): 9   APGAR (10 MINS):   Mom to postpartum.  Baby to Couplet care / Skin to Skin.  Gerhard Munch Sher Shampine MSN, CNM 11/24/2021, 10:52 PM

## 2020-12-30 DIAGNOSIS — Z20822 Contact with and (suspected) exposure to covid-19: Secondary | ICD-10-CM | POA: Diagnosis not present

## 2021-01-06 ENCOUNTER — Ambulatory Visit: Payer: Federal, State, Local not specified - PPO | Admitting: Registered Nurse

## 2021-01-06 ENCOUNTER — Other Ambulatory Visit: Payer: Self-pay

## 2021-01-06 ENCOUNTER — Encounter: Payer: Self-pay | Admitting: Registered Nurse

## 2021-01-06 VITALS — BP 104/70 | HR 70 | Temp 98.5°F | Resp 18 | Ht 60.0 in | Wt 172.4 lb

## 2021-01-06 DIAGNOSIS — U071 COVID-19: Secondary | ICD-10-CM | POA: Diagnosis not present

## 2021-01-06 NOTE — Patient Instructions (Signed)
° ° ° °  If you have lab work done today you will be contacted with your lab results within the next 2 weeks.  If you have not heard from us then please contact us. The fastest way to get your results is to register for My Chart. ° ° °IF you received an x-ray today, you will receive an invoice from [facility]. Please contact [facility] at [phone] with questions or concerns regarding your invoice.  ° °IF you received labwork today, you will receive an invoice from [facility]. Please contact [facility] at [phone] with questions or concerns regarding your invoice.  ° °Our billing staff will not be able to assist you with questions regarding bills from these companies. ° °You will be contacted with the lab results as soon as they are available. The fastest way to get your results is to activate your My Chart account. Instructions are located on the last page of this paperwork. If you have not heard from us regarding the results in 2 weeks, please contact this office. °  ° ° ° °

## 2021-01-18 ENCOUNTER — Other Ambulatory Visit: Payer: Self-pay

## 2021-01-18 ENCOUNTER — Ambulatory Visit: Payer: Federal, State, Local not specified - PPO | Admitting: Neurology

## 2021-01-18 ENCOUNTER — Encounter: Payer: Self-pay | Admitting: Neurology

## 2021-01-18 VITALS — BP 106/65 | HR 57 | Ht 60.0 in | Wt 177.0 lb

## 2021-01-18 DIAGNOSIS — R519 Headache, unspecified: Secondary | ICD-10-CM

## 2021-01-18 DIAGNOSIS — H539 Unspecified visual disturbance: Secondary | ICD-10-CM | POA: Diagnosis not present

## 2021-01-18 DIAGNOSIS — G43009 Migraine without aura, not intractable, without status migrainosus: Secondary | ICD-10-CM

## 2021-01-18 DIAGNOSIS — R51 Headache with orthostatic component, not elsewhere classified: Secondary | ICD-10-CM | POA: Diagnosis not present

## 2021-01-18 MED ORDER — RIZATRIPTAN BENZOATE 10 MG PO TBDP
10.0000 mg | ORAL_TABLET | ORAL | 11 refills | Status: DC | PRN
Start: 2021-01-18 — End: 2021-03-31

## 2021-01-18 MED ORDER — ONDANSETRON 4 MG PO TBDP: 4.0000 mg | ORAL_TABLET | Freq: Three times a day (TID) | ORAL | 3 refills | Status: DC | PRN

## 2021-01-18 NOTE — Patient Instructions (Signed)
Rizatriptan: Please take one tablet at the onset of your headache. If it does not improve the symptoms please take one additional tablet. Do not take more then 2 tablets in 24hrs. Do not take use more then 2 to 3 times in a week. Nausea: Ondansetron. May take this with Rizatriptan   Migraine Headache A migraine headache is an intense, throbbing pain on one side or both sides of the head. Migraine headaches may also cause other symptoms, such as nausea, vomiting, and sensitivity to light and noise. A migraine headache can last from 4 hours to 3 days. Talk with your doctor about what things may bring on (trigger) your migraine headaches. What are the causes? The exact cause of this condition is not known. However, a migraine may be caused when nerves in the brain become irritated and release chemicals that cause inflammation of blood vessels. This inflammation causes pain. This condition may be triggered or caused by:  Drinking alcohol.  Smoking.  Taking medicines, such as: ? Medicine used to treat chest pain (nitroglycerin). ? Birth control pills. ? Estrogen. ? Certain blood pressure medicines.  Eating or drinking products that contain nitrates, glutamate, aspartame, or tyramine. Aged cheeses, chocolate, or caffeine may also be triggers.  Doing physical activity. Other things that may trigger a migraine headache include:  Menstruation.  Pregnancy.  Hunger.  Stress.  Lack of sleep or too much sleep.  Weather changes.  Fatigue. What increases the risk? The following factors may make you more likely to experience migraine headaches:  Being a certain age. This condition is more common in people who are 2925-31 years old.  Being female.  Having a family history of migraine headaches.  Being Caucasian.  Having a mental health condition, such as depression or anxiety.  Being obese. What are the signs or symptoms? The main symptom of this condition is pulsating or throbbing  pain. This pain may:  Happen in any area of the head, such as on one side or both sides.  Interfere with daily activities.  Get worse with physical activity.  Get worse with exposure to bright lights or loud noises. Other symptoms may include:  Nausea.  Vomiting.  Dizziness.  General sensitivity to bright lights, loud noises, or smells. Before you get a migraine headache, you may get warning signs (an aura). An aura may include:  Seeing flashing lights or having blind spots.  Seeing bright spots, halos, or zigzag lines.  Having tunnel vision or blurred vision.  Having numbness or a tingling feeling.  Having trouble talking.  Having muscle weakness. Some people have symptoms after a migraine headache (postdromal phase), such as:  Feeling tired.  Difficulty concentrating. How is this diagnosed? A migraine headache can be diagnosed based on:  Your symptoms.  A physical exam.  Tests, such as: ? CT scan or an MRI of the head. These imaging tests can help rule out other causes of headaches. ? Taking fluid from the spine (lumbar puncture) and analyzing it (cerebrospinal fluid analysis, or CSF analysis). How is this treated? This condition may be treated with medicines that:  Relieve pain.  Relieve nausea.  Prevent migraine headaches. Treatment for this condition may also include:  Acupuncture.  Lifestyle changes like avoiding foods that trigger migraine headaches.  Biofeedback.  Cognitive behavioral therapy. Follow these instructions at home: Medicines  Take over-the-counter and prescription medicines only as told by your health care provider.  Ask your health care provider if the medicine prescribed to you: ? Requires  you to avoid driving or using heavy machinery. ? Can cause constipation. You may need to take these actions to prevent or treat constipation:  Drink enough fluid to keep your urine pale yellow.  Take over-the-counter or prescription  medicines.  Eat foods that are high in fiber, such as beans, whole grains, and fresh fruits and vegetables.  Limit foods that are high in fat and processed sugars, such as fried or sweet foods. Lifestyle  Do not drink alcohol.  Do not use any products that contain nicotine or tobacco, such as cigarettes, e-cigarettes, and chewing tobacco. If you need help quitting, ask your health care provider.  Get at least 8 hours of sleep every night.  Find ways to manage stress, such as meditation, deep breathing, or yoga. General instructions  Keep a journal to find out what may trigger your migraine headaches. For example, write down: ? What you eat and drink. ? How much sleep you get. ? Any change to your diet or medicines.  If you have a migraine headache: ? Avoid things that make your symptoms worse, such as bright lights. ? It may help to lie down in a dark, quiet room. ? Do not drive or use heavy machinery. ? Ask your health care provider what activities are safe for you while you are experiencing symptoms.  Keep all follow-up visits as told by your health care provider. This is important.      Contact a health care provider if:  You develop symptoms that are different or more severe than your usual migraine headache symptoms.  You have more than 15 headache days in one month. Get help right away if:  Your migraine headache becomes severe.  Your migraine headache lasts longer than 72 hours.  You have a fever.  You have a stiff neck.  You have vision loss.  Your muscles feel weak or like you cannot control them.  You start to lose your balance often.  You have trouble walking.  You faint.  You have a seizure. Summary  A migraine headache is an intense, throbbing pain on one side or both sides of the head. Migraines may also cause other symptoms, such as nausea, vomiting, and sensitivity to light and noise.  This condition may be treated with medicines and  lifestyle changes. You may also need to avoid certain things that trigger a migraine headache.  Keep a journal to find out what may trigger your migraine headaches.  Contact your health care provider if you have more than 15 headache days in a month or you develop symptoms that are different or more severe than your usual migraine headache symptoms. This information is not intended to replace advice given to you by your health care provider. Make sure you discuss any questions you have with your health care provider. Document Revised: 03/08/2019 Document Reviewed: 12/27/2018 Elsevier Patient Education  2021 [employer]  Magnetic Resonance Imaging Magnetic resonance imaging (MRI) is a painless test that takes pictures of the inside of your body. This test uses a strong magnet. It does not use X-rays. An MRI can show more details about a medical problem than other tests. Tell a health care provider about:  Any allergies you have.  All medicines you are taking. This includes vitamins, herbs, eye drops, creams, and over-the-counter medicines.  Any surgeries you have had.  Any medical problems you have.  Any metal you may have in your body. This includes: ? Any new joint, such as a man-made (  artificial) knee or hip. ? Any implanted devices, such as a pacemaker. ? An ear implant with metal (cochlear implant). ? An artificial heart valve. ? An object in your eye that has metal. ? Metal splinters. ? Pieces of a bullet. ? A port for insulin or chemotherapy.  Any tattoos you have.  If you have a birth control implant, such as an IUD.  Whether you are pregnant, may be pregnant, or are breastfeeding.  Any fear of small spaces (claustrophobia). You may be given a medicine to help you relax. What are the risks? Generally, this is a safe test. However, problems may occur. These include:  If you have metal in your body near the area being tested, it may be hard to get clear  pictures.  If you are pregnant, you should avoid MRI tests during the first three months of pregnancy.  If you are breastfeeding and dye will be used during your test, you may need to stop until the dye leaves your body.  If dye is used, there is a risk of an allergic reaction to the dye. You can take medicines to prevent this reaction or to treat it if you have symptoms.  If dye is used, it can cause damage to your kidneys. Drinking plenty of water before and after the test can help prevent this. What happens before the test?  You will be asked to take off all metal. This includes: ? Your watch, jewelry, and other metal things. ? Hearing aids. ? Dentures. ? An underwire bra. ? Makeup.  Braces and dental fillings normally are not a problem.  If you are breastfeeding, ask your doctor if you need to pump before your test. You may need to stop breastfeeding for a time if dye will be used. What happens during the test?  You may be given earplugs or headphones to listen to music. The MRI machine can be noisy.  You will lie down on a table.  If dye will be used, an IV tube will be placed into one of your veins. Dye will be given through your IV tube.  The table will slide into a tunnel. The tunnel has magnets inside of it. When you are inside the tunnel, you will still be able to talk to your doctor.  The tunnel will scan your body and make images. You will be asked to lie very still. Your doctor will tell you when you can move.  When all images are taken, the table will slide out of the tunnel. The test can take 30 minutes to over an hour. The test may vary among doctors and hospitals.   What can I expect after test?  If you were given a medicine to help you relax, you may be monitored until you leave the hospital or clinic. This includes checking your blood pressure, heart rate, breathing rate, and blood oxygen level.  If dye was used: ? It will leave your body through your pee  (urine). This takes about a day. ? You may be told to drink plenty of fluids. This helps your body get rid of the dye. ? Do not breastfeed your child until your doctor says that this is safe. Follow these instructions at home:  You may go back to your normal activities right away, or as told by your doctor.  It is up to you to get your test results. Ask how to get your results when they are ready.  Keep all follow-up visits. Summary  Magnetic resonance imaging (MRI) is a painless test that takes pictures of the inside of your body.  Dye may be used to get MRI pictures that are even more clear.  Before your MRI, be sure to tell your doctor about any metal you may have in your body.  Talk with your doctor about what your test results mean. This information is not intended to replace advice given to you by your health care provider. Make sure you discuss any questions you have with your health care provider. Document Revised: 03/18/2020 Document Reviewed: 03/18/2020 Elsevier Patient Education  2021 [employer] Ondansetron oral dissolving tablet What is this medicine? ONDANSETRON (on DAN se tron) is used to treat nausea and vomiting caused by chemotherapy. It is also used to prevent or treat nausea and vomiting after surgery. This medicine may be used for other purposes; ask your health care provider or pharmacist if you have questions. COMMON BRAND NAME(S): Zofran ODT What should I tell my health care provider before I take this medicine? They need to know if you have any of these conditions:  heart disease  history of irregular heartbeat  liver disease  low levels of magnesium or potassium in the blood  an unusual or allergic reaction to ondansetron, granisetron, other medicines, foods, dyes, or preservatives  pregnant or trying to get pregnant  breast-feeding How should I use this medicine? These tablets are made to dissolve in the mouth. Do not try to push the tablet  through the foil backing. With dry hands, peel away the foil backing and gently remove the tablet. Place the tablet in the mouth and allow it to dissolve, then swallow. While you may take these tablets with water, it is not necessary to do so. Talk to your pediatrician regarding the use of this medicine in children. Special care may be needed. Overdosage: If you think you have taken too much of this medicine contact a poison control center or emergency room at once. NOTE: This medicine is only for you. Do not share this medicine with others. What if I miss a dose? If you miss a dose, take it as soon as you can. If it is almost time for your next dose, take only that dose. Do not take double or extra doses. What may interact with this medicine? Do not take this medicine with any of the following medications:  apomorphine  certain medicines for fungal infections like fluconazole, itraconazole, ketoconazole, posaconazole, voriconazole  cisapride  dronedarone  pimozide  thioridazine This medicine may also interact with the following medications:  carbamazepine  certain medicines for depression, anxiety, or psychotic disturbances  fentanyl  linezolid  MAOIs like Carbex, Eldepryl, Marplan, Nardil, and Parnate  methylene blue (injected into a vein)  other medicines that prolong the QT interval (cause an abnormal heart rhythm) like dofetilide, ziprasidone  phenytoin  rifampicin  tramadol This list may not describe all possible interactions. Give your health care provider a list of all the medicines, herbs, non-prescription drugs, or dietary supplements you use. Also tell them if you smoke, drink alcohol, or use illegal drugs. Some items may interact with your medicine. What should I watch for while using this medicine? Check with your doctor or health care professional as soon as you can if you have any sign of an allergic reaction. What side effects may I notice from receiving  this medicine? Side effects that you should report to your doctor or health care professional as soon as possible:  allergic reactions  like skin rash, itching or hives, swelling of the face, lips, or tongue  breathing problems  confusion  dizziness  fast or irregular heartbeat  feeling faint or lightheaded, falls  fever and chills  loss of balance or coordination  seizures  sweating  swelling of the hands and feet  tightness in the chest  tremors  unusually weak or tired Side effects that usually do not require medical attention (report to your doctor or health care professional if they continue or are bothersome):  constipation or diarrhea  headache This list may not describe all possible side effects. Call your doctor for medical advice about side effects. You may report side effects to FDA at 1-800-FDA-1088. Where should I keep my medicine? Keep out of the reach of children. Store between 2 and 30 degrees C (36 and 86 degrees F). Throw away any unused medicine after the expiration date. NOTE: This sheet is a summary. It may not cover all possible information. If you have questions about this medicine, talk to your doctor, pharmacist, or health care provider.  2021 Elsevier/Gold Standard (2018-11-06 07:14:10) Rizatriptan disintegrating tablets What is this medicine? RIZATRIPTAN (rye za TRIP tan) is used to treat migraines with or without aura. An aura is a strange feeling or visual disturbance that warns you of an attack. It is not used to prevent migraines. This medicine may be used for other purposes; ask your health care provider or pharmacist if you have questions. COMMON BRAND NAME(S): Maxalt-MLT What should I tell my health care provider before I take this medicine? They need to know if you have any of these conditions:  cigarette smoker  circulation problems in fingers and toes  diabetes  heart disease  high blood pressure  high  cholesterol  history of irregular heartbeat  history of stroke  kidney disease  liver disease  stomach or intestine problems  an unusual or allergic reaction to rizatriptan, other medicines, foods, dyes, or preservatives  pregnant or trying to get pregnant  breast-feeding How should I use this medicine? Take this medicine by mouth. Follow the directions on the prescription label. Leave the tablet in the sealed blister pack until you are ready to take it. With dry hands, open the blister and gently remove the tablet. If the tablet breaks or crumbles, throw it away and take a new tablet out of the blister pack. Place the tablet in the mouth and allow it to dissolve, and then swallow. Do not cut, crush, or chew this medicine. You do not need water to take this medicine. Do not take it more often than directed. Talk to your pediatrician regarding the use of this medicine in children. While this drug may be prescribed for children as young as 6 years for selected conditions, precautions do apply. Overdosage: If you think you have taken too much of this medicine contact a poison control center or emergency room at once. NOTE: This medicine is only for you. Do not share this medicine with others. What if I miss a dose? This does not apply. This medicine is not for regular use. What may interact with this medicine? Do not take this medicine with any of the following medicines:  certain medicines for migraine headache like almotriptan, eletriptan, frovatriptan, naratriptan, rizatriptan, sumatriptan, zolmitriptan  ergot alkaloids like dihydroergotamine, ergonovine, ergotamine, methylergonovine  MAOIs like Carbex, Eldepryl, Marplan, Nardil, and Parnate This medicine may also interact with the following medications:  certain medicines for depression, anxiety, or psychotic disorders  propranolol This  list may not describe all possible interactions. Give your health care provider a list of all  the medicines, herbs, non-prescription drugs, or dietary supplements you use. Also tell them if you smoke, drink alcohol, or use illegal drugs. Some items may interact with your medicine. What should I watch for while using this medicine? Visit your healthcare professional for regular checks on your progress. Tell your healthcare professional if your symptoms do not start to get better or if they get worse. You may get drowsy or dizzy. Do not drive, use machinery, or do anything that needs mental alertness until you know how this medicine affects you. Do not stand up or sit up quickly, especially if you are an older patient. This reduces the risk of dizzy or fainting spells. Alcohol may interfere with the effect of this medicine. Your mouth may get dry. Chewing sugarless gum or sucking hard candy and drinking plenty of water may help. Contact your healthcare professional if the problem does not go away or is severe. If you take migraine medicines for 10 or more days a month, your migraines may get worse. Keep a diary of headache days and medicine use. Contact your healthcare professional if your migraine attacks occur more frequently. What side effects may I notice from receiving this medicine? Side effects that you should report to your doctor or health care professional as soon as possible:  allergic reactions like skin rash, itching or hives, swelling of the face, lips, or tongue  chest pain or chest tightness  signs and symptoms of a dangerous change in heartbeat or heart rhythm like chest pain; dizziness; fast, irregular heartbeat; palpitations; feeling faint or lightheaded; falls; breathing problems  signs and symptoms of a stroke like changes in vision; confusion; trouble speaking or understanding; severe headaches; sudden numbness or weakness of the face, arm or leg; trouble walking; dizziness; loss of balance or coordination  signs and symptoms of serotonin syndrome like irritable;  confusion; diarrhea; fast or irregular heartbeat; muscle twitching; stiff muscles; trouble walking; sweating; high fever; seizures; chills; vomiting Side effects that usually do not require medical attention (report to your doctor or health care professional if they continue or are bothersome):  diarrhea  dizziness  drowsiness  dry mouth  headache  nausea, vomiting  pain, tingling, numbness in the hands or feet  stomach pain This list may not describe all possible side effects. Call your doctor for medical advice about side effects. You may report side effects to FDA at 1-800-FDA-1088. Where should I keep my medicine? Keep out of the reach of children. Store at room temperature between 15 and 30 degrees C (59 and 86 degrees F). Protect from light and moisture. Throw away any unused medicine after the expiration date. NOTE: This sheet is a summary. It may not cover all possible information. If you have questions about this medicine, talk to your doctor, pharmacist, or health care provider.  2021 [employer]vier/Gold Standard (2018-05-29 14:58:08)

## 2021-01-18 NOTE — Progress Notes (Signed)
[id number]    Provider:  Dr Lucia Gaskins Requesting Provider: Janeece Agee, NP Primary Care Provider:  Janeece Agee, NP  CC:  headaches  HPI:  Tina Thornton is a 30 y.o. female here as requested by Janeece Agee, NP for migraines. PMHx headaches, She has been having headaches for years, more on the left frontal area behind the eyes, pulsating/throbbing, light sensitivity, dizziness, a dark room helps, sleep may help, bright light makes it worse as does movement. Tylenol helps. In the afternoon at work. She works at a distribution center, 2x a week, can last hoiurs and stop her from going to work, she can wake up with headaches and they can be severe in the morning with blurry vision, she has been woken up by a severe throbbing headache, she doesn't snore, she wake up and feel breathless occ but no snoring and no daytime somnolence, at least 8 migraine/headache days a month. No weather or period triggers. She sleeps well. No aura. No medication overuse. No other focal neurologic deficits, associated symptoms, inciting events or modifiable factors.   Reviewed notes, labs and imaging from outside physicians, which showed:  TSH(07/31/2020), CBC, CMP unremarkable (06/23/2020)  I reviewed Dr. Vincente Liberty notes. Patient having frequent worsening headaches.   Review of Systems: Patient complains of symptoms per HPI as well as the following symptoms: headache. Pertinent negatives and positives per HPI. All others negative.   Social History   Socioeconomic History  . Marital status: Single    Spouse name: n/a  . Number of children: 0  . Years of education: Bachelor's Degree  . Highest education level: Not on file  Occupational History  . Occupation: Doctor, hospital    Comment: [employer]  Tobacco Use  . Smoking status: Never Smoker  . Smokeless tobacco: Never Used  . Tobacco comment: Hooka  Vaping Use  . Vaping Use: Never used  Substance and Sexual Activity  . Alcohol use:  Yes    Comment: "just socially"  . Drug use: No  . Sexual activity: Yes    Partners: Male    Birth control/protection: None    Comment: inconsistent condom use  Other Topics Concern  . Not on file  Social History Narrative   Undergraduate degree in Psychology from [employer].   Lives alone.   Mother lives nearby.   One sister lives in [place], [place].   Father, brother and youngest sister live in [place].   Right handed   Caffeine: 2 large starbucks frappuchinos per week, occasional green tea    Social Determinants of Health   Financial Resource Strain: Not on file  Food Insecurity: Not on file  Transportation Needs: Not on file  Physical Activity: Not on file  Stress: Not on file  Social Connections: Not on file  Intimate Partner Violence: Not on file    Family History  Problem Relation Age of Onset  . Arthritis Paternal Grandmother   . Headache Neg Hx   . Migraines Neg Hx     History reviewed. No pertinent past medical history.  Patient Active Problem List   Diagnosis Date Noted  . Migraine without aura and without status migrainosus, not intractable 01/18/2021  . Ganglion cyst of dorsum of right wrist 05/14/2020  . Elevated liver enzymes 08/12/2019  . Chronic nonintractable headache 06/27/2019  . Right calf pain 06/28/2017    Past Surgical History:  Procedure Laterality Date  . DENTAL SURGERY      Current Outpatient Medications  Medication Sig Dispense Refill  .  azelastine (ASTELIN) 0.1 % nasal spray Place 1 spray into both nostrils 2 (two) times daily. Use in each nostril as directed 30 mL 12  . butalbital-acetaminophen-caffeine (FIORICET) 50-325-40 MG tablet Take 1-2 tablets by mouth every 6 (six) hours as needed for headache. 20 tablet 0  . fluticasone (FLONASE) 50 MCG/ACT nasal spray Place 2 sprays into both nostrils daily. 16 g 6  . hydrocortisone (ANUSOL-HC) 2.5 % rectal cream Place 1 application rectally 2 (two) times daily. 30 g 0  . omeprazole (PRILOSEC) 40 MG  capsule Take 1 capsule (40 mg total) by mouth daily. 30 capsule 3  . ondansetron (ZOFRAN-ODT) 4 MG disintegrating tablet Take 1 tablet (4 mg total) by mouth every 8 (eight) hours as needed for nausea. 30 tablet 3  . rizatriptan (MAXALT-MLT) 10 MG disintegrating tablet Take 1 tablet (10 mg total) by mouth as needed for migraine. May repeat in 2 hours if needed 9 tablet 11   No current facility-administered medications for this visit.    Allergies as of 01/18/2021  . (No Known Allergies)    Vitals: BP 106/65 (BP Location: Right Arm, Patient Position: Sitting)   Pulse (!) 57   Ht 5' (1.524 m)   Wt 177 lb (80.3 kg)   BMI 34.57 kg/m  Last Weight:  Wt Readings from Last 1 Encounters:  01/18/21 177 lb (80.3 kg)   Last Height:   Ht Readings from Last 1 Encounters:  01/18/21 5' (1.524 m)     Physical exam: Exam: Gen: NAD, conversant, well nourised, obese, well groomed                     CV: RRR, no MRG. No Carotid Bruits. No peripheral edema, warm, nontender Eyes: Conjunctivae clear without exudates or hemorrhage  Neuro: Detailed Neurologic Exam  Speech:    Speech is normal; fluent and spontaneous with normal comprehension.  Cognition:    The patient is oriented to person, place, and time;     recent and remote memory intact;     language fluent;     normal attention, concentration,     fund of knowledge Cranial Nerves:    The pupils are equal, round, and reactive to light. The fundi are normal and spontaneous venous pulsations are present. Visual fields are full to finger confrontation. Extraocular movements are intact. Trigeminal sensation is intact and the muscles of mastication are normal. The face is symmetric. The palate elevates in the midline. Hearing intact. Voice is normal. Shoulder shrug is normal. The tongue has normal motion without fasciculations.   Coordination:    Normal finger to nose   Gait:    Normal native gait   Motor Observation:    No asymmetry,  no atrophy, and no involuntary movements noted. Tone:    Normal muscle tone.    Posture:    Posture is normal. normal erect    Strength:    Strength is V/V in the upper and lower limbs.      Sensation: intact to LT     Reflex Exam:  DTR's:    Deep tendon reflexes in the upper and lower extremities are normal bilaterally.   Toes:    The toes are downgoing bilaterally.   Clonus:    Clonus is absent.    Assessment/Plan:  Sweet 30 year old patient here for headaches which are likely migraines without aura. However she has some concerning symptoms such as nocturnal headaches, morning headaches with positional quality, blurry vision, should  have thorough evaluation.  We discussed migraines in depth, she has never been diagnosed with migraines, I do think this is the likely diagnosis but given concerning symptoms as above she should have an MRI of the brain.  Recent lab work was normal.  We discussed lifestyle factors and modifications, acute migraine management, preventative migraine therapy, at this time we will start her on acute management and monitor clinically.   MRI of the brain : MRI brain due to concerning symptoms of morning headaches, positional headaches,vision changes  to look for space occupying mass, chiari or intracranial hypertension (pseudotumor). Rizatriptan: Please take one tablet at the onset of your headache. If it does not improve the symptoms please take one additional tablet. Do not take more then 2 tablets in 24hrs. Do not take use more then 2 to 3 times in a week. Nausea: Ondansetron. May take this with Rizatriptan  No orders of the defined types were placed in this encounter.  Meds ordered this encounter  Medications  . rizatriptan (MAXALT-MLT) 10 MG disintegrating tablet    Sig: Take 1 tablet (10 mg total) by mouth as needed for migraine. May repeat in 2 hours if needed    Dispense:  9 tablet    Refill:  11  . ondansetron (ZOFRAN-ODT) 4 MG disintegrating  tablet    Sig: Take 1 tablet (4 mg total) by mouth every 8 (eight) hours as needed for nausea.    Dispense:  30 tablet    Refill:  3    Cc: Janeece Agee, NP,  Janeece Agee, NP  Naomie Dean, MD  [facility] [address] [place], [place] [address]  Phone [phone] Fax [phone]

## 2021-01-21 ENCOUNTER — Telehealth: Payer: Self-pay | Admitting: Neurology

## 2021-01-21 NOTE — Telephone Encounter (Signed)
45 mins MRI brain w/wo contrast Dr. Valaria Good npr ref: [id number]  Scheduled at [facility] 02/02/21

## 2021-02-02 ENCOUNTER — Other Ambulatory Visit: Payer: Federal, State, Local not specified - PPO

## 2021-02-02 NOTE — Telephone Encounter (Signed)
Patient r/s for 02/16/21. °

## 2021-02-07 ENCOUNTER — Other Ambulatory Visit: Payer: Self-pay

## 2021-02-07 ENCOUNTER — Emergency Department (HOSPITAL_COMMUNITY)
Admission: EM | Admit: 2021-02-07 | Discharge: 2021-02-08 | Disposition: A | Discharge status: Home / Self Care | Payer: Federal, State, Local not specified - PPO | Attending: Emergency Medicine | Admitting: Emergency Medicine

## 2021-02-07 ENCOUNTER — Encounter (HOSPITAL_COMMUNITY): Payer: Self-pay

## 2021-02-07 DIAGNOSIS — N83202 Unspecified ovarian cyst, left side: Secondary | ICD-10-CM | POA: Insufficient documentation

## 2021-02-07 DIAGNOSIS — R1032 Left lower quadrant pain: Secondary | ICD-10-CM | POA: Diagnosis not present

## 2021-02-07 HISTORY — DX: Other specified health status: Z78.9

## 2021-02-07 NOTE — ED Triage Notes (Signed)
Pt reports that she is having pain to left lower part of abdomen since Friday. Denies any urinary symptoms. Denies nausea/vomiting or fever. Pain feels like a stabbing pain.

## 2021-02-08 ENCOUNTER — Emergency Department (HOSPITAL_COMMUNITY): Payer: Federal, State, Local not specified - PPO

## 2021-02-08 DIAGNOSIS — R1032 Left lower quadrant pain: Secondary | ICD-10-CM | POA: Diagnosis not present

## 2021-02-08 DIAGNOSIS — N83202 Unspecified ovarian cyst, left side: Secondary | ICD-10-CM | POA: Diagnosis not present

## 2021-02-08 LAB — COMPREHENSIVE METABOLIC PANEL
ALT: 35 U/L (ref 0–44)
AST: 37 U/L (ref 15–41)
Albumin: 3.8 g/dL (ref 3.5–5.0)
Alkaline Phosphatase: 50 U/L (ref 38–126)
Anion gap: 6 (ref 5–15)
BUN: 11 mg/dL (ref 6–20)
CO2: 24 mmol/L (ref 22–32)
Calcium: 9.3 mg/dL (ref 8.9–10.3)
Chloride: 108 mmol/L (ref 98–111)
Creatinine, Ser: 0.97 mg/dL (ref 0.44–1.00)
GFR, Estimated: >60 mL/min (ref >60)
Glucose, Bld: 95 mg/dL (ref 70–99)
Potassium: 4.3 mmol/L (ref 3.5–5.1)
Sodium: 138 mmol/L (ref 135–145)
Total Bilirubin: 0.8 mg/dL (ref 0.3–1.2)
Total Protein: 7.4 g/dL (ref 6.5–8.1)

## 2021-02-08 LAB — URINALYSIS, ROUTINE W REFLEX MICROSCOPIC
Bilirubin Urine: NEGATIVE
Glucose, UA: NEGATIVE mg/dL
Hgb urine dipstick: NEGATIVE
Ketones, ur: NEGATIVE mg/dL
Leukocytes,Ua: NEGATIVE
Nitrite: NEGATIVE
Protein, ur: NEGATIVE mg/dL
Specific Gravity, Urine: 1.018 (ref 1.005–1.030)
pH: 7 (ref 5.0–8.0)

## 2021-02-08 LAB — CBC
HCT: 40.2 % (ref 36.0–46.0)
Hemoglobin: 13.4 g/dL (ref 12.0–15.0)
MCH: 30.1 pg (ref 26.0–34.0)
MCHC: 33.3 g/dL (ref 30.0–36.0)
MCV: 90.3 fL (ref 80.0–100.0)
Platelets: 250 10*3/uL (ref 150–400)
RBC: 4.45 MIL/uL (ref 3.87–5.11)
RDW: 13.1 % (ref 11.5–15.5)
WBC: 6.8 10*3/uL (ref 4.0–10.5)
nRBC: 0 % (ref 0.0–0.2)

## 2021-02-08 LAB — I-STAT BETA HCG BLOOD, ED (MC, WL, AP ONLY): I-stat hCG, quantitative: <5 m[IU]/mL (ref <5)

## 2021-02-08 MED ORDER — IOHEXOL 300 MG/ML  SOLN
100.0000 mL | Freq: Once | INTRAMUSCULAR | Status: AC | PRN
  Administered 2021-02-08: 100 mL via INTRAVENOUS

## 2021-02-08 MED ORDER — MORPHINE SULFATE (PF) 4 MG/ML IV SOLN
4.0000 mg | Freq: Once | INTRAVENOUS | Status: AC
Start: 2021-02-08 — End: 2021-02-08
  Administered 2021-02-08: 4 mg via INTRAVENOUS
  Filled 2021-02-08: qty 1

## 2021-02-08 MED ORDER — HYDROCODONE-ACETAMINOPHEN 5-325 MG PO TABS: 1.0000 | ORAL_TABLET | ORAL | 0 refills | Status: DC | PRN

## 2021-02-08 NOTE — Discharge Instructions (Signed)
Your CT scan showed a cyst on the left ovary, but I do not think that is what is causing your pain.  The cause for your pain is not clear.  CT scan and lab work does not show any other significant pathology.  You may take ibuprofen and/or acetaminophen for pain.  For severe pain, you may take hydrocodone-acetaminophen.  Return to the emergency department if pain is getting worse, you start running a fever, or start vomiting.

## 2021-02-08 NOTE — ED Provider Notes (Signed)
[facility] EMERGENCY DEPARTMENT Provider Note   CSN: [id number] Arrival date & time: 02/07/21  2037    History Chief Complaint  Patient presents with  . Abdominal Pain    Left lower abdomen.    Tina Thornton is a 30 y.o. female.  The history is provided by the patient.  Abdominal Pain She has history of elevated liver enzymes, migraines and comes in complaining of left lower quadrant pain for the last 2 days.  Pain is constant, but is now moving to the periumbilical area.  She rates pain at 10/10.  It is worse when she bends, but nothing makes it any better.  She has taken ibuprofen without relief.  She denies nausea or vomiting.  She denies urinary urgency, frequency, tenesmus, dysuria.  She denies constipation or diarrhea.  Last menses was February 13, but she states that she normally goes 5-6 weeks between menses.  [name] not using any form of contraception.  Past Medical History:  Diagnosis Date  . Known health problems: none     Patient Active Problem List   Diagnosis Date Noted  . Migraine without aura and without status migrainosus, not intractable 01/18/2021  . Ganglion cyst of dorsum of right wrist 05/14/2020  . Elevated liver enzymes 08/12/2019  . Chronic nonintractable headache 06/27/2019  . Right calf pain 06/28/2017    Past Surgical History:  Procedure Laterality Date  . DENTAL SURGERY       OB History   No obstetric history on file.     Family History  Problem Relation Age of Onset  . Arthritis Paternal Grandmother   . Headache Neg Hx   . Migraines Neg Hx     Social History   Tobacco Use  . Smoking status: Never Smoker  . Smokeless tobacco: Never Used  . Tobacco comment: Hooka  Vaping Use  . Vaping Use: Never used  Substance Use Topics  . Alcohol use: Yes    Comment: "just socially"  . Drug use: No    Home Medications Prior to Admission medications   Medication Sig Start Date End Date Taking? Authorizing Provider   azelastine (ASTELIN) 0.1 % nasal spray Place 1 spray into both nostrils 2 (two) times daily. Use in each nostril as directed 10/30/20   Janeece Agee, NP  butalbital-acetaminophen-caffeine (FIORICET) [phone] MG tablet Take 1-2 tablets by mouth every 6 (six) hours as needed for headache. 10/30/20 10/30/21  Janeece Agee, NP  fluticasone (FLONASE) 50 MCG/ACT nasal spray Place 2 sprays into both nostrils daily. 10/30/20   Janeece Agee, NP  hydrocortisone (ANUSOL-HC) 2.5 % rectal cream Place 1 application rectally 2 (two) times daily. 08/12/19   Janeece Agee, NP  omeprazole (PRILOSEC) 40 MG capsule Take 1 capsule (40 mg total) by mouth daily. 10/30/20   Janeece Agee, NP  ondansetron (ZOFRAN-ODT) 4 MG disintegrating tablet Take 1 tablet (4 mg total) by mouth every 8 (eight) hours as needed for nausea. 01/18/21   Anson Fret, MD  rizatriptan (MAXALT-MLT) 10 MG disintegrating tablet Take 1 tablet (10 mg total) by mouth as needed for migraine. May repeat in 2 hours if needed 01/18/21   Anson Fret, MD    Allergies    Patient has no known allergies.  Review of Systems   Review of Systems  Gastrointestinal: Positive for abdominal pain.  All other systems reviewed and are negative.   Physical Exam Updated Vital Signs BP 122/70 (BP Location: Left Arm)   Pulse 65  Temp 98.2 F (36.8 C) (Oral)   Resp 20   Ht 5' (1.524 m)   Wt 78 kg   LMP 01/18/2021   SpO2 100%   BMI 33.59 kg/m   Physical Exam Vitals and nursing note reviewed.   30 year old female, resting comfortably and in no acute distress. Vital signs are normal. Oxygen saturation is 100%, which is normal. Head is normocephalic and atraumatic. PERRLA, EOMI. Oropharynx is clear. Neck is nontender and supple without adenopathy or JVD. Back is nontender and there is no CVA tenderness. Lungs are clear without rales, wheezes, or rhonchi. Chest is nontender. Heart has regular rate and rhythm without murmur. Abdomen is  soft, flat, with moderate tenderness in the left lower quadrant and periumbilical area.  There is no rebound or guarding.  There are no masses or hepatosplenomegaly and peristalsis is slightly hypoactive. Extremities have no cyanosis or edema, full range of motion is present. Skin is warm and dry without rash. Neurologic: Mental status is normal, cranial nerves are intact, there are no motor or sensory deficits.  ED Results / Procedures / Treatments   Labs (all labs ordered are listed, but only abnormal results are displayed) Labs Reviewed  URINALYSIS, ROUTINE W REFLEX MICROSCOPIC - Abnormal; Notable for the following components:      Result Value   APPearance HAZY (*)    All other components within normal limits  COMPREHENSIVE METABOLIC PANEL  CBC  I-STAT BETA HCG BLOOD, ED ([facility], [facility], [facility] ONLY)   Radiology CT ABDOMEN PELVIS W CONTRAST  Result Date: 02/08/2021 CLINICAL DATA:  Left lower quadrant pain.  Diverticulitis suspected. EXAM: CT ABDOMEN AND PELVIS WITH CONTRAST TECHNIQUE: Multidetector CT imaging of the abdomen and pelvis was performed using the standard protocol following bolus administration of intravenous contrast. CONTRAST:  OMNIPAQUE IOHEXOL 300 MG/ML  SOLN COMPARISON:  None. FINDINGS: Lower chest: No acute abnormality. Hepatobiliary: No focal liver abnormality. No gallstones, gallbladder wall thickening, or pericholecystic fluid. No biliary dilatation. Pancreas: No focal lesion. Normal pancreatic contour. No surrounding inflammatory changes. No main pancreatic ductal dilatation. Spleen: Normal in size without focal abnormality. Adrenals/Urinary Tract: No adrenal nodule bilaterally. Bilateral kidneys excrete symmetrically. No hydronephrosis. No hydroureter. The urinary bladder is unremarkable. There is no urothelial wall thickening and there are no filling defects in the opacified portions of the bilateral collecting systems or ureters. Stomach/Bowel: Stomach is within normal  limits. No evidence of bowel wall thickening or dilatation. Appendix appears normal. Vascular/Lymphatic: No significant vascular findings are present. No enlarged abdominal or pelvic lymph nodes. Reproductive: A simple appearing 3.8 cm left ovarian cystic lesion is noted. Otherwise the uterus and bilateral adnexa are unremarkable. Other: Trace free fluid in the right lower quadrant. No intraperitoneal free gas. No organized fluid collection. Musculoskeletal: No acute or significant osseous findings. IMPRESSION: 1. A simple appearing 3.8 cm left ovarian cystic lesion. No follow-up imaging recommended. Note: This recommendation does not apply to premenarchal patients and to those with increased risk (genetic, family history, elevated tumor markers or other high-risk factors) of ovarian cancer. Reference: JACR 2020 Feb; 17(2):248-254. 2. Otherwise no acute intra-abdominal or intrapelvic abnormality. Electronically Signed   By: Tish Frederickson M.D.   On: 02/08/2021 01:27    Procedures Procedures   Medications Ordered in ED Medications  morphine 4 MG/ML injection 4 mg (4 mg Intravenous Given 02/08/21 0031)  iohexol (OMNIPAQUE) 300 MG/ML solution 100 mL (100 mLs Intravenous Contrast Given 02/08/21 0107)    ED Course  I have  reviewed the triage vital signs and the nursing notes.  Pertinent labs & imaging results that were available during my care of the patient were reviewed by me and considered in my medical decision making (see chart for details).  MDM Rules/Calculators/A&P Left lower quadrant pain of uncertain cause.  Differential is rather broad and includes conditions with significant morbidity and mortality.  Differential includes ovarian cyst, urolithiasis, urinary tract infection, diverticulitis, ruptured ectopic pregnancy.  Old records are reviewed, and she has no relevant past visits.  CBC has come back normal, remainder of labs are still pending.  She will be sent for CT of abdomen and pelvis.    Metabolic panel and lipase are normal as is urinalysis.  CT scan is significant only for a left ovarian cyst.  Since her tenderness seems to extend up to the umbilicus, I do not feel the cyst is actually the source of her pain.  Source of the pain is unclear.  No evidence of urolithiasis or diverticulitis.  [name] discharged with instructions to follow-up with her gynecologist regarding her cyst.  Advised to use over-the-counter analgesics as needed for pain.  Given a prescription for a small number of hydrocodone-acetaminophen tablets to use as needed.  Return if symptoms worsen or if new symptoms develop.  Final Clinical Impression(s) / ED Diagnoses Final diagnoses:  LLQ abdominal pain  Left ovarian cyst    Rx / DC Orders ED Discharge Orders         Ordered    HYDROcodone-acetaminophen (NORCO) 5-325 MG tablet  Every 4 hours PRN        02/08/21 0143           Dione Booze, MD 02/08/21 0147

## 2021-02-16 ENCOUNTER — Other Ambulatory Visit: Payer: Federal, State, Local not specified - PPO

## 2021-02-23 NOTE — Telephone Encounter (Signed)
Patient is r/s for 03/02/21.

## 2021-02-23 NOTE — Telephone Encounter (Signed)
I left a voicemail for pt to call back to r/s her mri.

## 2021-03-02 ENCOUNTER — Other Ambulatory Visit: Payer: Federal, State, Local not specified - PPO

## 2021-03-11 DIAGNOSIS — Z01419 Encounter for gynecological examination (general) (routine) without abnormal findings: Secondary | ICD-10-CM | POA: Diagnosis not present

## 2021-03-11 DIAGNOSIS — Z6834 Body mass index (BMI) 34.0-34.9, adult: Secondary | ICD-10-CM | POA: Diagnosis not present

## 2021-03-11 DIAGNOSIS — N83202 Unspecified ovarian cyst, left side: Secondary | ICD-10-CM | POA: Diagnosis not present

## 2021-03-11 DIAGNOSIS — R102 Pelvic and perineal pain: Secondary | ICD-10-CM | POA: Diagnosis not present

## 2021-03-31 ENCOUNTER — Encounter (HOSPITAL_COMMUNITY): Payer: Self-pay | Admitting: Obstetrics and Gynecology

## 2021-03-31 ENCOUNTER — Inpatient Hospital Stay (HOSPITAL_COMMUNITY): Payer: Federal, State, Local not specified - PPO

## 2021-03-31 ENCOUNTER — Inpatient Hospital Stay (HOSPITAL_COMMUNITY)
Admission: AD | Admit: 2021-03-31 | Discharge: 2021-03-31 | Disposition: A | Discharge status: Home / Self Care | Payer: Federal, State, Local not specified - PPO | Attending: Obstetrics and Gynecology | Admitting: Obstetrics and Gynecology

## 2021-03-31 ENCOUNTER — Other Ambulatory Visit: Payer: Self-pay

## 2021-03-31 DIAGNOSIS — R1032 Left lower quadrant pain: Secondary | ICD-10-CM | POA: Diagnosis not present

## 2021-03-31 DIAGNOSIS — O3680X0 Pregnancy with inconclusive fetal viability, not applicable or unspecified: Secondary | ICD-10-CM

## 2021-03-31 DIAGNOSIS — R102 Pelvic and perineal pain: Secondary | ICD-10-CM | POA: Diagnosis not present

## 2021-03-31 DIAGNOSIS — Z3A01 Less than 8 weeks gestation of pregnancy: Secondary | ICD-10-CM | POA: Diagnosis not present

## 2021-03-31 DIAGNOSIS — O26891 Other specified pregnancy related conditions, first trimester: Secondary | ICD-10-CM | POA: Diagnosis not present

## 2021-03-31 DIAGNOSIS — D259 Leiomyoma of uterus, unspecified: Secondary | ICD-10-CM | POA: Diagnosis not present

## 2021-03-31 LAB — URINALYSIS, ROUTINE W REFLEX MICROSCOPIC
Bilirubin Urine: NEGATIVE
Glucose, UA: NEGATIVE mg/dL
Hgb urine dipstick: NEGATIVE
Ketones, ur: NEGATIVE mg/dL
Leukocytes,Ua: NEGATIVE
Nitrite: NEGATIVE
Protein, ur: NEGATIVE mg/dL
Specific Gravity, Urine: 1.008 (ref 1.005–1.030)
pH: 5 (ref 5.0–8.0)

## 2021-03-31 LAB — COMPREHENSIVE METABOLIC PANEL
ALT: 27 U/L (ref 0–44)
AST: 21 U/L (ref 15–41)
Albumin: 3.5 g/dL (ref 3.5–5.0)
Alkaline Phosphatase: 56 U/L (ref 38–126)
Anion gap: 8 (ref 5–15)
BUN: 14 mg/dL (ref 6–20)
CO2: 23 mmol/L (ref 22–32)
Calcium: 9.3 mg/dL (ref 8.9–10.3)
Chloride: 105 mmol/L (ref 98–111)
Creatinine, Ser: 0.83 mg/dL (ref 0.44–1.00)
GFR, Estimated: >60 mL/min (ref >60)
Glucose, Bld: 103 mg/dL — ABNORMAL HIGH (ref 70–99)
Potassium: 3.7 mmol/L (ref 3.5–5.1)
Sodium: 136 mmol/L (ref 135–145)
Total Bilirubin: 0.5 mg/dL (ref 0.3–1.2)
Total Protein: 6.8 g/dL (ref 6.5–8.1)

## 2021-03-31 LAB — CBC
HCT: 35.6 % — ABNORMAL LOW (ref 36.0–46.0)
Hemoglobin: 11.9 g/dL — ABNORMAL LOW (ref 12.0–15.0)
MCH: 29.8 pg (ref 26.0–34.0)
MCHC: 33.4 g/dL (ref 30.0–36.0)
MCV: 89 fL (ref 80.0–100.0)
Platelets: 246 10*3/uL (ref 150–400)
RBC: 4 MIL/uL (ref 3.87–5.11)
RDW: 13.2 % (ref 11.5–15.5)
WBC: 6.9 10*3/uL (ref 4.0–10.5)
nRBC: 0 % (ref 0.0–0.2)

## 2021-03-31 LAB — WET PREP, GENITAL
Clue Cells Wet Prep HPF POC: NONE SEEN
Sperm: NONE SEEN
Trich, Wet Prep: NONE SEEN
Yeast Wet Prep HPF POC: NONE SEEN

## 2021-03-31 LAB — GC/CHLAMYDIA PROBE AMP (~~LOC~~) NOT AT ARMC
Chlamydia: NEGATIVE
Comment: NEGATIVE
Comment: NORMAL
Neisseria Gonorrhea: NEGATIVE

## 2021-03-31 LAB — POCT PREGNANCY, URINE: Preg Test, Ur: POSITIVE — AB

## 2021-03-31 LAB — HCG, QUANTITATIVE, PREGNANCY: hCG, Beta Chain, Quant, S: 8453 m[IU]/mL — ABNORMAL HIGH (ref <5)

## 2021-03-31 NOTE — MAU Note (Signed)
Pt reports pelvic pain. Pt reports she is [redacted] weeks pregnant, denies bleeding.

## 2021-03-31 NOTE — Discharge Instructions (Signed)
https://www.cdc.gov/pregnancy/infections.html">  First Trimester of Pregnancy  The first trimester of pregnancy starts on the first day of your last menstrual period until the end of week 12. This is also called months 1 through 3 of pregnancy. Body changes during your first trimester Your body goes through many changes during pregnancy. The changes usually return to normal after your baby is born. Physical changes  You may gain or lose weight.  Your breasts may grow larger and hurt. The area around your nipples may get darker.  Dark spots or blotches may develop on your face.  You may have changes in your hair. Health changes  You may feel like you might vomit (nauseous), and you may vomit.  You may have heartburn.  You may have headaches.  You may have trouble pooping (constipation).  Your gums may bleed. Other changes  You may get tired easily.  You may pee (urinate) more often.  Your menstrual periods will stop.  You may not feel hungry.  You may want to eat certain kinds of food.  You may have changes in your emotions from day to day.  You may have more dreams. Follow these instructions at home: Medicines  Take over-the-counter and prescription medicines only as told by your doctor. Some medicines are not safe during pregnancy.  Take a prenatal vitamin that contains at least 600 micrograms (mcg) of folic acid. Eating and drinking  Eat healthy meals that include: ? Fresh fruits and vegetables. ? Whole grains. ? Good sources of protein, such as meat, eggs, or tofu. ? Low-fat dairy products.  Avoid raw meat and unpasteurized juice, milk, and cheese.  If you feel like you may vomit, or you vomit: ? Eat 4 or 5 small meals a day instead of 3 large meals. ? Try eating a few soda crackers. ? Drink liquids between meals instead of during meals.  You may need to take these actions to prevent or treat trouble pooping: ? Drink enough fluids to keep your pee  (urine) pale yellow. ? Eat foods that are high in fiber. These include beans, whole grains, and fresh fruits and vegetables. ? Limit foods that are high in fat and sugar. These include fried or sweet foods. Activity  Exercise only as told by your doctor. Most people can do their usual exercise routine during pregnancy.  Stop exercising if you have cramps or pain in your lower belly (abdomen) or low back.  Do not exercise if it is too hot or too humid, or if you are in a place of great height (high altitude).  Avoid heavy lifting.  If you choose to, you may have sex unless your doctor tells you not to. Relieving pain and discomfort  Wear a good support bra if your breasts are sore.  Rest with your legs raised (elevated) if you have leg cramps or low back pain.  If you have bulging veins (varicose veins) in your legs: ? Wear support hose as told by your doctor. ? Raise your feet for 15 minutes, 3-4 times a day. ? Limit salt in your food. Safety  Wear your seat belt at all times when you are in a car.  Talk with your doctor if someone is hurting you or yelling at you.  Talk with your doctor if you are feeling sad or have thoughts of hurting yourself. Lifestyle  Do not use hot tubs, steam rooms, or saunas.  Do not douche. Do not use tampons or scented sanitary pads.  Do not   use herbal medicines, illegal drugs, or medicines that are not approved by your doctor. Do not drink alcohol.  Do not smoke or use any products that contain nicotine or tobacco. If you need help quitting, ask your doctor.  Avoid cat litter boxes and soil that is used by cats. These carry germs that can cause harm to the baby and can cause a loss of your baby by miscarriage or stillbirth. General instructions  Keep all follow-up visits. This is important.  Ask for help if you need counseling or if you need help with nutrition. Your doctor can give you advice or tell you where to go for help.  Visit your  dentist. At home, brush your teeth with a soft toothbrush. Floss gently.  Write down your questions. Take them to your prenatal visits. Where to find more information  American Pregnancy Association: americanpregnancy.org  American College of Obstetricians and Gynecologists: www.acog.org  Office on Women's Health: womenshealth.gov/pregnancy Contact a doctor if:  You are dizzy.  You have a fever.  You have mild cramps or pressure in your lower belly.  You have a nagging pain in your belly area.  You continue to feel like you may vomit, you vomit, or you have watery poop (diarrhea) for 24 hours or longer.  You have a bad-smelling fluid coming from your vagina.  You have pain when you pee.  You are exposed to a disease that spreads from person to person, such as chickenpox, measles, Zika virus, HIV, or hepatitis. Get help right away if:  You have spotting or bleeding from your vagina.  You have very bad belly cramping or pain.  You have shortness of breath or chest pain.  You have any kind of injury, such as from a fall or a car crash.  You have new or increased pain, swelling, or redness in an arm or leg. Summary  The first trimester of pregnancy starts on the first day of your last menstrual period until the end of week 12 (months 1 through 3).  Eat 4 or 5 small meals a day instead of 3 large meals.  Do not smoke or use any products that contain nicotine or tobacco. If you need help quitting, ask your doctor.  Keep all follow-up visits. This information is not intended to replace advice given to you by your health care provider. Make sure you discuss any questions you have with your health care provider. Document Revised: 04/22/2020 Document Reviewed: 02/27/2020 Elsevier Patient Education  2021 [employer]  

## 2021-03-31 NOTE — MAU Provider Note (Signed)
Chief Complaint: Possible Pregnancy and Pelvic Pain   Event Date/Time   First Provider Initiated Contact with Patient 03/31/21 0230        SUBJECTIVE HPI: Tina Thornton is a 30 y.o. G1P0 at [redacted]w[redacted]d by LMP who presents to maternity admissions reporting sharp pain in Left lower abdomen since March. Was seen for this in ED in March when she was not pregnant, found to have an ovarian cyst.  Was seen in [facility] office in April and was not pregnant as of April 13.  States they did an exam but no cultures.   She denies vaginal bleeding, vaginal itching/burning, urinary symptoms, h/a, dizziness, n/v, or fever/chills.    Possible Pregnancy This is a new problem. The current episode started 1 to 4 weeks ago. Associated symptoms include abdominal pain. Pertinent negatives include no chills, fatigue, fever, headaches, myalgias, nausea or vomiting.  Pelvic Pain The patient's primary symptoms include pelvic pain. The patient's pertinent negatives include no genital itching, genital lesions, genital odor, vaginal bleeding or vaginal discharge. This is a new problem. The current episode started 1 to 4 weeks ago. The problem has been unchanged. The problem affects the left side. [name] pregnant. Associated symptoms include abdominal pain. Pertinent negatives include no chills, constipation, diarrhea, fever, headaches, nausea or vomiting. The symptoms are aggravated by tactile pressure. She has tried nothing for the symptoms.   RN Note: Pt reports pelvic pain. Pt reports she is [redacted] weeks pregnant, denies bleeding.  Past Medical History:  Diagnosis Date  . Known health problems: none    Past Surgical History:  Procedure Laterality Date  . DENTAL SURGERY     Social History   Socioeconomic History  . Marital status: Single    Spouse name: n/a  . Number of children: 0  . Years of education: Bachelor's Degree  . Highest education level: Not on file  Occupational History  . Occupation: Doctor, hospital     Comment: [employer]  Tobacco Use  . Smoking status: Never Smoker  . Smokeless tobacco: Never Used  . Tobacco comment: Hooka  Vaping Use  . Vaping Use: Never used  Substance and Sexual Activity  . Alcohol use: Yes    Comment: "just socially"  . Drug use: No  . Sexual activity: Yes    Partners: Male    Birth control/protection: None    Comment: inconsistent condom use  Other Topics Concern  . Not on file  Social History Narrative   Undergraduate degree in Psychology from [employer].   Lives alone.   Mother lives nearby.   One sister lives in [place], [place].   Father, brother and youngest sister live in [place].   Right handed   Caffeine: 2 large starbucks frappuchinos per week, occasional green tea    Social Determinants of Health   Financial Resource Strain: Not on file  Food Insecurity: Not on file  Transportation Needs: Not on file  Physical Activity: Not on file  Stress: Not on file  Social Connections: Not on file  Intimate Partner Violence: Not on file   No current facility-administered medications on file prior to encounter.   Current Outpatient Medications on File Prior to Encounter  Medication Sig Dispense Refill  . azelastine (ASTELIN) 0.1 % nasal spray Place 1 spray into both nostrils 2 (two) times daily. Use in each nostril as directed 30 mL 12  . butalbital-acetaminophen-caffeine (FIORICET) 50-325-40 MG tablet Take 1-2 tablets by mouth every 6 (six) hours as needed for headache. 20 tablet 0  .  fluticasone (FLONASE) 50 MCG/ACT nasal spray Place 2 sprays into both nostrils daily. 16 g 6  . HYDROcodone-acetaminophen (NORCO) 5-325 MG tablet Take 1 tablet by mouth every 4 (four) hours as needed for moderate pain. 10 tablet 0  . hydrocortisone (ANUSOL-HC) 2.5 % rectal cream Place 1 application rectally 2 (two) times daily. 30 g 0  . omeprazole (PRILOSEC) 40 MG capsule Take 1 capsule (40 mg total) by mouth daily. 30 capsule 3  . ondansetron (ZOFRAN-ODT) 4 MG disintegrating tablet Take  1 tablet (4 mg total) by mouth every 8 (eight) hours as needed for nausea. 30 tablet 3  . rizatriptan (MAXALT-MLT) 10 MG disintegrating tablet Take 1 tablet (10 mg total) by mouth as needed for migraine. May repeat in 2 hours if needed 9 tablet 11   No Known Allergies  I have reviewed patient's Past Medical Hx, Surgical Hx, Family Hx, Social Hx, medications and allergies.   ROS:  Review of Systems  Constitutional: Negative for chills, fatigue and fever.  Gastrointestinal: Positive for abdominal pain. Negative for constipation, diarrhea, nausea and vomiting.  Genitourinary: Positive for pelvic pain. Negative for vaginal discharge.  Musculoskeletal: Negative for myalgias.  Neurological: Negative for headaches.   Review of Systems  Other systems negative   Physical Exam  Physical Exam Patient Vitals for the past 24 hrs:  BP Temp Temp src Pulse Resp SpO2 Height Weight  03/31/21 0155 118/65 98.4 F (36.9 C) Oral 66 16 100 % 5' (1.524 m) 79.4 kg   Constitutional: Well-developed, well-nourished female in no acute distress.  Cardiovascular: normal rate Respiratory: normal effort GI: Abd soft, non-tender. Pos BS x 4 MS: Extremities nontender, no edema, normal ROM Neurologic: Alert and oriented x 4.  GU: Neg CVAT.  PELVIC EXAM:  Bimanual exam: Cervix 0/long/high, firm, anterior, neg CMT, uterus tender, adnexa without tenderness on left.    LAB RESULTS Results for orders placed or performed during the hospital encounter of 03/31/21 (from the past 24 hour(s))  Pregnancy, urine POC     Status: Abnormal   Collection Time: 03/31/21  2:07 AM  Result Value Ref Range   Preg Test, Ur POSITIVE (A) NEGATIVE  Urinalysis, Routine w reflex microscopic Urine, Clean Catch     Status: Abnormal   Collection Time: 03/31/21  2:09 AM  Result Value Ref Range   Color, Urine STRAW (A) YELLOW   APPearance CLEAR CLEAR   Specific Gravity, Urine 1.008 1.005 - 1.030   pH 5.0 5.0 - 8.0   Glucose, UA  NEGATIVE NEGATIVE mg/dL   Hgb urine dipstick NEGATIVE NEGATIVE   Bilirubin Urine NEGATIVE NEGATIVE   Ketones, ur NEGATIVE NEGATIVE mg/dL   Protein, ur NEGATIVE NEGATIVE mg/dL   Nitrite NEGATIVE NEGATIVE   Leukocytes,Ua NEGATIVE NEGATIVE  Wet prep, genital     Status: Abnormal   Collection Time: 03/31/21  2:45 AM  Result Value Ref Range   Yeast Wet Prep HPF POC NONE SEEN NONE SEEN   Trich, Wet Prep NONE SEEN NONE SEEN   Clue Cells Wet Prep HPF POC NONE SEEN NONE SEEN   WBC, Wet Prep HPF POC MODERATE (A) NONE SEEN   Sperm NONE SEEN   CBC     Status: Abnormal   Collection Time: 03/31/21  3:24 AM  Result Value Ref Range   WBC 6.9 4.0 - 10.5 K/uL   RBC 4.00 3.87 - 5.11 MIL/uL   Hemoglobin 11.9 (L) 12.0 - 15.0 g/dL   HCT 62.135.6 (L) 30.836.0 - 65.746.0 %  MCV 89.0 80.0 - 100.0 fL   MCH 29.8 26.0 - 34.0 pg   MCHC 33.4 30.0 - 36.0 g/dL   RDW 37.1 06.2 - 69.4 %   Platelets 246 150 - 400 K/uL   nRBC 0.0 0.0 - 0.2 %  Comprehensive metabolic panel     Status: Abnormal   Collection Time: 03/31/21  3:24 AM  Result Value Ref Range   Sodium 136 135 - 145 mmol/L   Potassium 3.7 3.5 - 5.1 mmol/L   Chloride 105 98 - 111 mmol/L   CO2 23 22 - 32 mmol/L   Glucose, Bld 103 (H) 70 - 99 mg/dL   BUN 14 6 - 20 mg/dL   Creatinine, Ser 8.54 0.44 - 1.00 mg/dL   Calcium 9.3 8.9 - 62.7 mg/dL   Total Protein 6.8 6.5 - 8.1 g/dL   Albumin 3.5 3.5 - 5.0 g/dL   AST 21 15 - 41 U/L   ALT 27 0 - 44 U/L   Alkaline Phosphatase 56 38 - 126 U/L   Total Bilirubin 0.5 0.3 - 1.2 mg/dL   GFR, Estimated >03 >50 mL/min   Anion gap 8 5 - 15  hCG, quantitative, pregnancy     Status: Abnormal   Collection Time: 03/31/21  3:24 AM  Result Value Ref Range   hCG, Beta Chain, Quant, S 8,453 (H) <5 mIU/mL    IMAGING US OB LESS THAN 14 WEEKS WITH OB TRANSVAGINAL  Result Date: 03/31/2021 CLINICAL DATA:  Left lower quadrant pain. EXAM: OBSTETRIC <14 WK [place] AND TRANSVAGINAL OB US TECHNIQUE: Both transabdominal and transvaginal  ultrasound examinations were performed for complete evaluation of the gestation as well as the maternal uterus, adnexal regions, and pelvic cul-de-sac. Transvaginal technique was performed to assess early pregnancy. COMPARISON:  None. FINDINGS: Intrauterine gestational sac: Single Yolk sac:  Not Visualized. Embryo:  Not Visualized. Cardiac Activity: Not Visualized. Heart Rate: N/A  bpm MSD: 4.8 mm   5 w   1 d Subchorionic hemorrhage:  None visualized. Maternal uterus/adnexae: A 0.8 cm x 0.8 cm x 0.9 cm heterogeneous uterine fibroid is noted. The right ovary measures 1.6 cm x 3.0 cm x 2.0 cm and is normal in appearance. The left ovary measures 1.8 cm x 1.4 cm x 1.7 cm and is normal in appearance. IMPRESSION: Single intrauterine gestational sac, at approximately 5 weeks and 1 day gestation by ultrasound evaluation, without visualization of a yolk sac or fetal pole. While this may be secondary to early intrauterine pregnancy, correlation with follow-up pelvic ultrasound is recommended. Electronically Signed   By: Aram Candela M.D.   On: 03/31/2021 04:01     MAU Management/MDM: Ordered usual first trimester r/o ectopic labs.   Pelvic exam and cultures done Will check baseline Ultrasound to rule out ectopic. This bleeding/pain can represent a normal pregnancy with bleeding, spontaneous abortion or even an ectopic which can be life-threatening.  The process as listed above helps to determine which of these is present.  Discussed results. Recommend repeat US in 7-10 days  Dr Connye Burkitt called to schedule her for followup in their office  ASSESSMENT Pregnancy at [redacted]w[redacted]d Sharp pain in pelvis x 2 months Single GS at 5wks seen but no YS or embryo   PLAN Discharge home Will repeat  Ultrasound in about 7-10 days in [facility] office, Dr Connye Burkitt to arrange Ectopic/SAB precautions  Pt stable at time of discharge. Encouraged to return here if she develops worsening of symptoms, increase in pain, fever, or other  concerning symptoms.    Wynelle Bourgeois CNM, MSN Certified Nurse-Midwife 03/31/2021  2:30 AM

## 2021-04-07 DIAGNOSIS — O3680X9 Pregnancy with inconclusive fetal viability, other fetus: Secondary | ICD-10-CM | POA: Diagnosis not present

## 2021-04-07 DIAGNOSIS — Z3A01 Less than 8 weeks gestation of pregnancy: Secondary | ICD-10-CM | POA: Diagnosis not present

## 2021-04-07 DIAGNOSIS — R102 Pelvic and perineal pain: Secondary | ICD-10-CM | POA: Diagnosis not present

## 2021-04-21 DIAGNOSIS — N925 Other specified irregular menstruation: Secondary | ICD-10-CM | POA: Diagnosis not present

## 2021-04-21 DIAGNOSIS — Z113 Encounter for screening for infections with a predominantly sexual mode of transmission: Secondary | ICD-10-CM | POA: Diagnosis not present

## 2021-04-21 DIAGNOSIS — O3680X9 Pregnancy with inconclusive fetal viability, other fetus: Secondary | ICD-10-CM | POA: Diagnosis not present

## 2021-04-21 DIAGNOSIS — Z3A08 8 weeks gestation of pregnancy: Secondary | ICD-10-CM | POA: Diagnosis not present

## 2021-04-21 LAB — HEPATITIS C ANTIBODY: HCV Ab: NEGATIVE

## 2021-04-21 LAB — OB RESULTS CONSOLE ANTIBODY SCREEN: Antibody Screen: NEGATIVE

## 2021-04-21 LAB — OB RESULTS CONSOLE HIV ANTIBODY (ROUTINE TESTING): HIV: NONREACTIVE

## 2021-04-21 LAB — OB RESULTS CONSOLE ABO/RH: RH Type: POSITIVE

## 2021-04-28 LAB — OB RESULTS CONSOLE RUBELLA ANTIBODY, IGM: Rubella: IMMUNE

## 2021-04-28 LAB — OB RESULTS CONSOLE GC/CHLAMYDIA: Gonorrhea: NEGATIVE

## 2021-04-28 LAB — OB RESULTS CONSOLE RPR: RPR: NONREACTIVE

## 2021-04-28 LAB — OB RESULTS CONSOLE HEPATITIS B SURFACE ANTIGEN: Hepatitis B Surface Ag: NEGATIVE

## 2021-05-17 DIAGNOSIS — Z3490 Encounter for supervision of normal pregnancy, unspecified, unspecified trimester: Secondary | ICD-10-CM | POA: Diagnosis not present

## 2021-07-19 DIAGNOSIS — Z3A20 20 weeks gestation of pregnancy: Secondary | ICD-10-CM | POA: Diagnosis not present

## 2021-07-19 DIAGNOSIS — Z3686 Encounter for antenatal screening for cervical length: Secondary | ICD-10-CM | POA: Diagnosis not present

## 2021-07-19 DIAGNOSIS — Z363 Encounter for antenatal screening for malformations: Secondary | ICD-10-CM | POA: Diagnosis not present

## 2021-07-21 ENCOUNTER — Ambulatory Visit: Payer: Federal, State, Local not specified - PPO

## 2021-08-21 IMAGING — DX DG WRIST COMPLETE 3+V*R*
4 series · 4 of 4 positions shown · non-contrast
Comparison: None.

CLINICAL DATA: Bump on the right wrist for several years. Numbness
in the index through little fingers.

EXAM:
RIGHT WRIST - COMPLETE 3+ VIEW

[wrist pa]
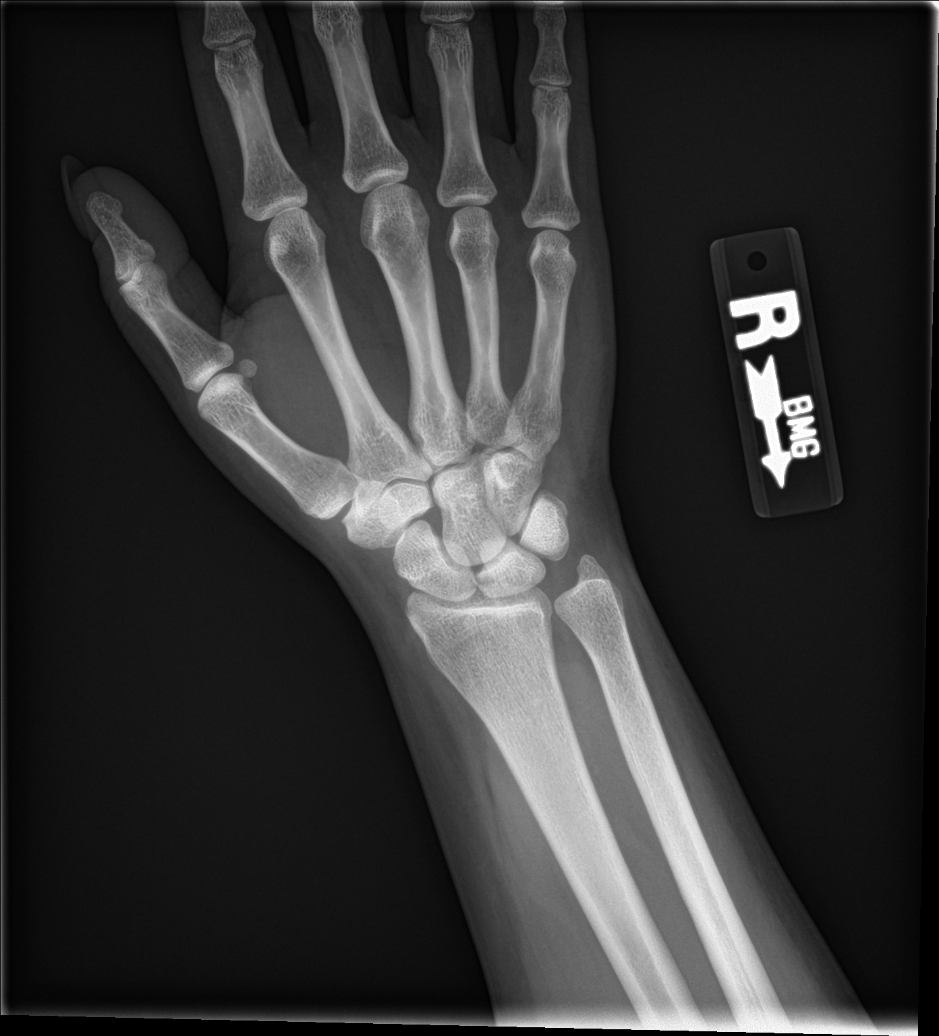

[wrist obl]
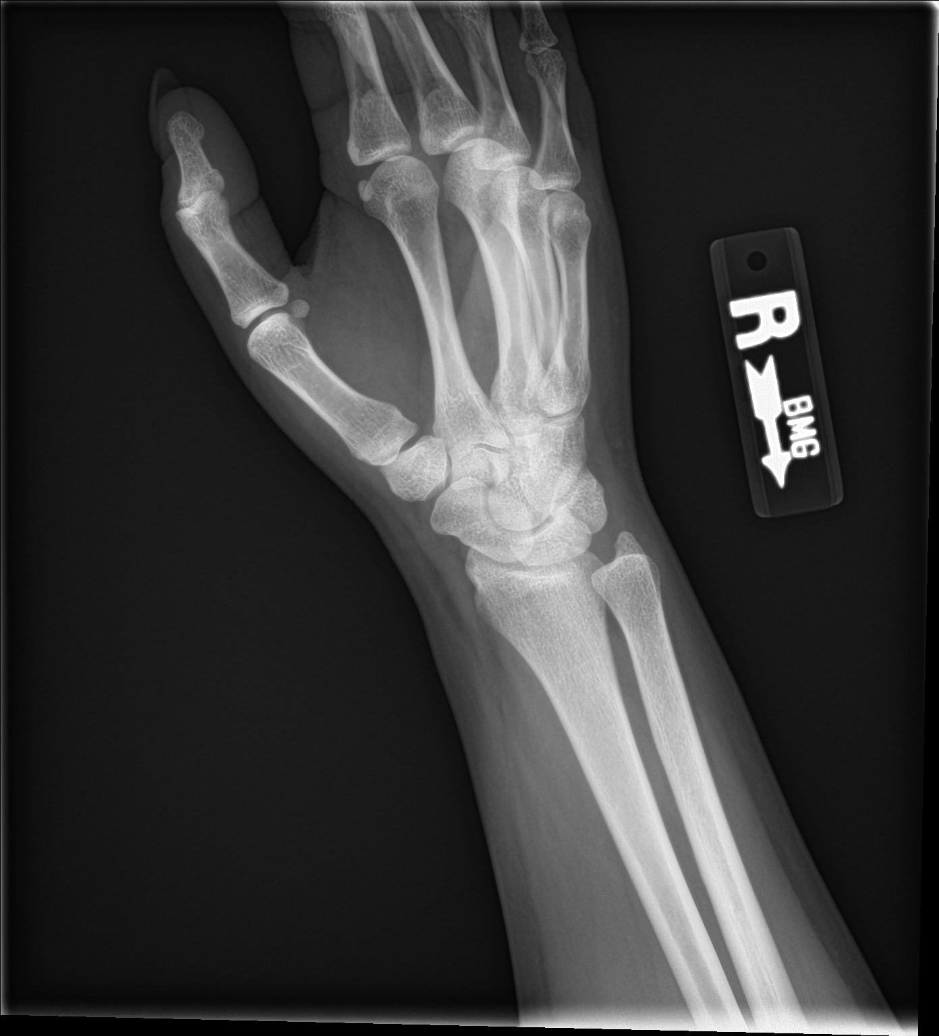

[wrist lat]
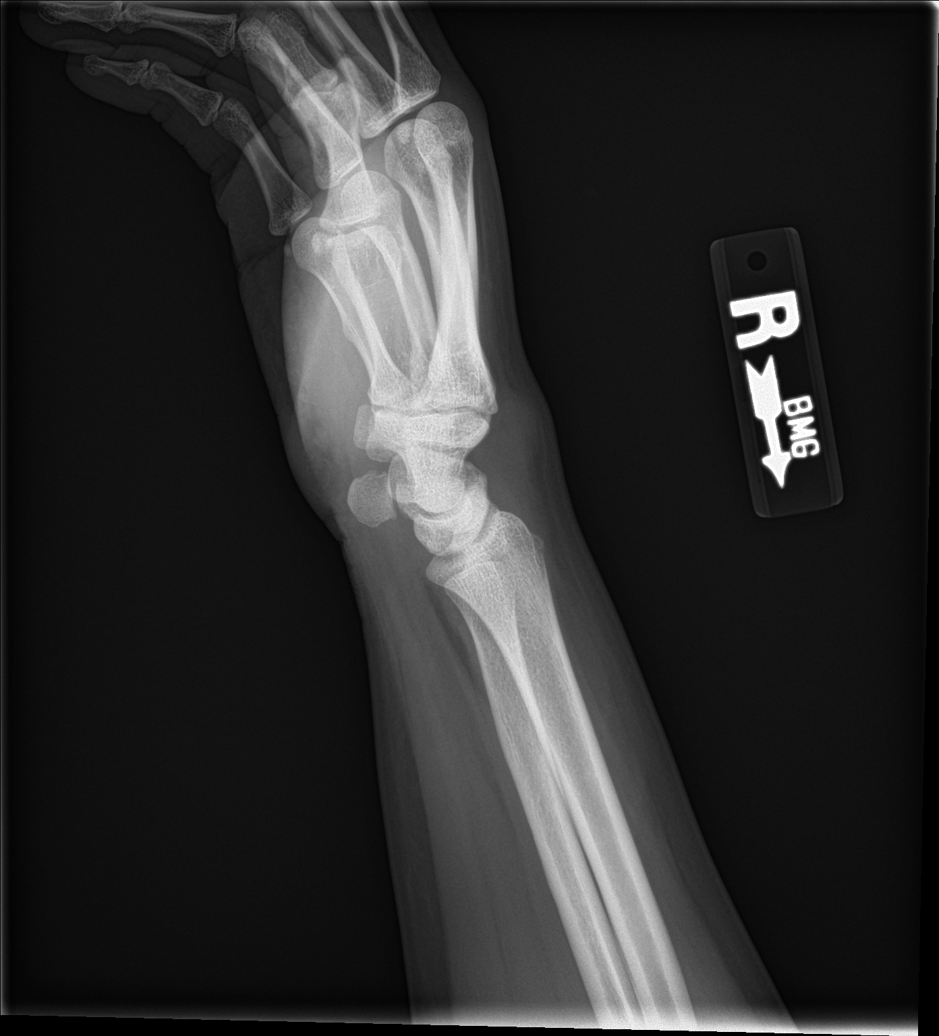

[wrist navicular]
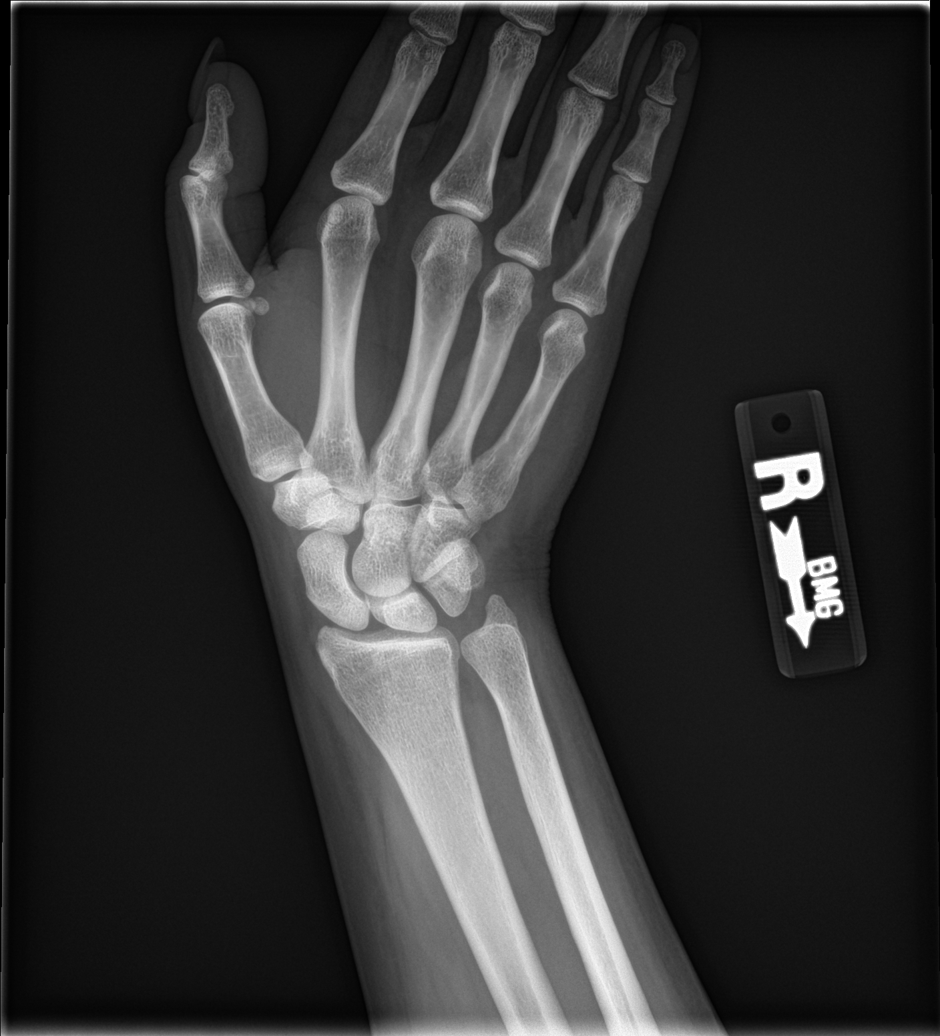

[4 of 4 positions shown; findings below may reference images not displayed]

FINDINGS: No acute bony or joint abnormality is identified. There is a small
focus of prominence in the dorsal soft tissues of the wrist. No soft
tissue gas or radiopaque foreign body.
IMPRESSION: Focus of soft tissue prominence on the dorsum of the wrist may
represent the patient's palpated lesion. Finding is nonspecific.

Negative for bony or joint abnormality.

## 2021-08-23 DIAGNOSIS — Z3A25 25 weeks gestation of pregnancy: Secondary | ICD-10-CM | POA: Diagnosis not present

## 2021-08-23 DIAGNOSIS — Z362 Encounter for other antenatal screening follow-up: Secondary | ICD-10-CM | POA: Diagnosis not present

## 2021-09-14 DIAGNOSIS — Z362 Encounter for other antenatal screening follow-up: Secondary | ICD-10-CM | POA: Diagnosis not present

## 2021-10-05 ENCOUNTER — Other Ambulatory Visit: Payer: Self-pay | Admitting: Obstetrics and Gynecology

## 2021-10-05 DIAGNOSIS — O24419 Gestational diabetes mellitus in pregnancy, unspecified control: Secondary | ICD-10-CM

## 2021-10-13 ENCOUNTER — Encounter: Payer: Federal, State, Local not specified - PPO | Attending: Obstetrics and Gynecology | Admitting: Registered"

## 2021-10-13 ENCOUNTER — Encounter: Payer: Self-pay | Admitting: Registered"

## 2021-10-13 ENCOUNTER — Other Ambulatory Visit: Payer: Self-pay

## 2021-10-13 DIAGNOSIS — O24419 Gestational diabetes mellitus in pregnancy, unspecified control: Secondary | ICD-10-CM | POA: Insufficient documentation

## 2021-10-13 NOTE — Progress Notes (Signed)
Patient was seen on 10/13/21 for Gestational Diabetes self-management class at the [facility]. The following learning objectives were met by the patient during this course:  States the definition of Gestational Diabetes States why dietary management is important in controlling blood glucose Describes the effects each nutrient has on blood glucose levels Demonstrates ability to create a balanced meal plan Demonstrates carbohydrate counting  States when to check blood glucose levels Demonstrates proper blood glucose monitoring techniques States the effect of stress and exercise on blood glucose levels States the importance of limiting caffeine and abstaining from alcohol and smoking  Blood glucose monitor given: OneTouch Verio Flex Lot# [id number] x Exp: 11/27/2021 Blood Glucose: 122 mg/dL  Patient instructed to monitor glucose levels: FBS: 60 - <95; 1 hour: <140; 2 hour: <120  Patient received handouts: Nutrition Diabetes and Pregnancy, including carb counting list  Patient will be seen for follow-up as needed.

## 2021-10-15 DIAGNOSIS — Z3A33 33 weeks gestation of pregnancy: Secondary | ICD-10-CM | POA: Diagnosis not present

## 2021-10-15 DIAGNOSIS — Z369 Encounter for antenatal screening, unspecified: Secondary | ICD-10-CM | POA: Diagnosis not present

## 2021-10-18 DIAGNOSIS — Z3A33 33 weeks gestation of pregnancy: Secondary | ICD-10-CM | POA: Diagnosis not present

## 2021-10-18 DIAGNOSIS — O24419 Gestational diabetes mellitus in pregnancy, unspecified control: Secondary | ICD-10-CM | POA: Diagnosis not present

## 2021-10-25 DIAGNOSIS — O24419 Gestational diabetes mellitus in pregnancy, unspecified control: Secondary | ICD-10-CM | POA: Diagnosis not present

## 2021-10-26 ENCOUNTER — Ambulatory Visit: Payer: Federal, State, Local not specified - PPO | Admitting: *Deleted

## 2021-10-26 ENCOUNTER — Encounter: Payer: Self-pay | Admitting: *Deleted

## 2021-10-26 ENCOUNTER — Ambulatory Visit
Payer: Federal, State, Local not specified - PPO | Attending: Obstetrics and Gynecology | Admitting: Obstetrics and Gynecology

## 2021-10-26 ENCOUNTER — Other Ambulatory Visit: Payer: Self-pay | Admitting: Obstetrics and Gynecology

## 2021-10-26 ENCOUNTER — Other Ambulatory Visit: Payer: Self-pay

## 2021-10-26 ENCOUNTER — Ambulatory Visit: Payer: Federal, State, Local not specified - PPO | Attending: Obstetrics and Gynecology

## 2021-10-26 VITALS — BP 116/64 | HR 95

## 2021-10-26 DIAGNOSIS — O24415 Gestational diabetes mellitus in pregnancy, controlled by oral hypoglycemic drugs: Secondary | ICD-10-CM

## 2021-10-26 DIAGNOSIS — O24419 Gestational diabetes mellitus in pregnancy, unspecified control: Secondary | ICD-10-CM

## 2021-10-26 DIAGNOSIS — Z3A35 35 weeks gestation of pregnancy: Secondary | ICD-10-CM

## 2021-10-26 DIAGNOSIS — O358XX0 Maternal care for other (suspected) fetal abnormality and damage, not applicable or unspecified: Secondary | ICD-10-CM

## 2021-10-26 NOTE — Progress Notes (Addendum)
Maternal-Fetal Medicine   Name: Tina Thornton DOB: 09-01-1991 MRN: [id number] Referring Provider: Nigel Bridgeman, CNM  I had the pleasure of seeing [facility] Thornton today at [facility]. [name] G2 P0010 at 35-weeks' gestation and is here for ultrasound evaluation of her pregnancy.  Patient has gestational diabetes and reports glipizide was prescribed today.  Her fasting levels are around 100 mg/DL and postprandial levels are within normal range.  She reports she regularly checks her blood glucose levels. She had opted not to screen for fetal aneuploidies. Fetal anatomical survey performed at your office was normal.  Past medical history: No history of hypertension or any other chronic medical conditions. Past surgical history: Nil of note. Medications prenatal vitamins, iron supplements, glipizide. Allergies: No known drug allergies. Social history: Denies tobacco or drug or alcohol use.  Ultrasound Fetal growth is appropriate for gestational age.  Amniotic fluid is normal good fetal activity seen.  Fetal anatomical survey appears normal but limited by advanced gestational age.  An echogenic intracardiac focus is seen (previously seen at your office).  Antenatal testing is reassuring.  Cephalic presentation.  BPP 8/8.  Our concerns include:  Gestational diabetes I explained the diagnosis of gestational diabetes.  I emphasized the importance of good blood glucose control to prevent adverse fetal or neonatal outcomes.  I discussed blood glucose normal values. I encouraged her to check her blood glucose regularly.  Possible complications of gestational diabetes include fetal macrosomia, shoulder dystocia and birth injuries, stillbirth (in poorly controlled diabetes) and neonatal respiratory syndrome and other complications.  Diet and exercise reduce the need for insulin.  Medical treatment includes oral hypoglycemics or insulin. Glipizide is similar to glyburide in its  action and can cause severe neonatal hypoglycemia.  Although randomized trial has establish the safety of glyburide, metformin is preferred over glyburide/glipizide because of hypoglycemia.  If glipizide is continued.  APNP stopped about before delivery to prevent neonatal hypoglycemia.  Exact time to stop before delivery is not known. I discussed insulin treatment, but patient does not prefer to take insulin. Timing of delivery: In well-controlled diabetes on diet, patient can be delivered at 75- or 40-weeks' gestation.  If diabetes not well controlled, early term delivery (37 to [redacted] weeks gestation) may be considered.   Type 2 diabetes develops in about 25% to 40% of women with GDM. I recommend postpartum screening with 75-g glucose load at 6 to 12 weeks after delivery.  I encouraged her to undergo postpartum screening.  Recommendations - Continue weekly BPP till delivery.  Patient prefers ultrasound to be performed at your office. -If diabetes not well controlled, early term delivery (37 or [redacted] weeks gestation) should be considered. -Consider switching to metformin.  If glipizide is continued, it may be stopped about 48 hours before delivery.  Thank you for consultation.  If you have any questions or concerns, please contact me the [facility].  Consultation including face-to-face (more than 50%) counseling 30 minutes.

## 2021-10-28 DIAGNOSIS — Z369 Encounter for antenatal screening, unspecified: Secondary | ICD-10-CM | POA: Diagnosis not present

## 2021-10-28 DIAGNOSIS — Z3A35 35 weeks gestation of pregnancy: Secondary | ICD-10-CM | POA: Diagnosis not present

## 2021-11-10 DIAGNOSIS — O24419 Gestational diabetes mellitus in pregnancy, unspecified control: Secondary | ICD-10-CM | POA: Diagnosis not present

## 2021-11-10 DIAGNOSIS — Z369 Encounter for antenatal screening, unspecified: Secondary | ICD-10-CM | POA: Diagnosis not present

## 2021-11-10 DIAGNOSIS — Z3A37 37 weeks gestation of pregnancy: Secondary | ICD-10-CM | POA: Diagnosis not present

## 2021-11-12 ENCOUNTER — Other Ambulatory Visit: Payer: Self-pay | Admitting: Obstetrics & Gynecology

## 2021-11-15 LAB — OB RESULTS CONSOLE GBS: GBS: NEGATIVE

## 2021-11-22 ENCOUNTER — Other Ambulatory Visit: Payer: Self-pay

## 2021-11-23 ENCOUNTER — Inpatient Hospital Stay (HOSPITAL_COMMUNITY)
Admission: AD | Admit: 2021-11-23 | Discharge: 2021-11-26 | DRG: 806 | Disposition: A | Discharge status: Home / Self Care | Payer: Federal, State, Local not specified - PPO | Attending: Obstetrics and Gynecology | Admitting: Obstetrics and Gynecology

## 2021-11-23 ENCOUNTER — Inpatient Hospital Stay (HOSPITAL_COMMUNITY): Payer: Federal, State, Local not specified - PPO

## 2021-11-23 ENCOUNTER — Encounter (HOSPITAL_COMMUNITY): Payer: Self-pay | Admitting: Obstetrics & Gynecology

## 2021-11-23 DIAGNOSIS — Z3A39 39 weeks gestation of pregnancy: Secondary | ICD-10-CM

## 2021-11-23 DIAGNOSIS — Z20822 Contact with and (suspected) exposure to covid-19: Secondary | ICD-10-CM | POA: Diagnosis not present

## 2021-11-23 DIAGNOSIS — O9832 Other infections with a predominantly sexual mode of transmission complicating childbirth: Secondary | ICD-10-CM | POA: Diagnosis not present

## 2021-11-23 DIAGNOSIS — A6000 Herpesviral infection of urogenital system, unspecified: Secondary | ICD-10-CM | POA: Diagnosis present

## 2021-11-23 DIAGNOSIS — Z3490 Encounter for supervision of normal pregnancy, unspecified, unspecified trimester: Secondary | ICD-10-CM | POA: Diagnosis present

## 2021-11-23 DIAGNOSIS — O24425 Gestational diabetes mellitus in childbirth, controlled by oral hypoglycemic drugs: Principal | ICD-10-CM | POA: Diagnosis present

## 2021-11-23 HISTORY — DX: Type 2 diabetes mellitus without complications: E11.9

## 2021-11-23 HISTORY — DX: Unspecified abnormal cytological findings in specimens from vagina: R87.629

## 2021-11-23 HISTORY — DX: Anemia, unspecified: D64.9

## 2021-11-23 LAB — RESP PANEL BY RT-PCR (FLU A&B, COVID) ARPGX2
Influenza A by PCR: NEGATIVE
Influenza B by PCR: NEGATIVE
SARS Coronavirus 2 by RT PCR: NEGATIVE

## 2021-11-23 LAB — CBC
HCT: 34.8 % — ABNORMAL LOW (ref 36.0–46.0)
Hemoglobin: 11.7 g/dL — ABNORMAL LOW (ref 12.0–15.0)
MCH: 30.5 pg (ref 26.0–34.0)
MCHC: 33.6 g/dL (ref 30.0–36.0)
MCV: 90.6 fL (ref 80.0–100.0)
Platelets: 181 10*3/uL (ref 150–400)
RBC: 3.84 MIL/uL — ABNORMAL LOW (ref 3.87–5.11)
RDW: 13.2 % (ref 11.5–15.5)
WBC: 7.6 10*3/uL (ref 4.0–10.5)
nRBC: 0 % (ref 0.0–0.2)

## 2021-11-23 LAB — GLUCOSE, CAPILLARY: Glucose-Capillary: 89 mg/dL (ref 70–99)

## 2021-11-23 LAB — HIV ANTIBODY (ROUTINE TESTING W REFLEX): HIV Screen 4th Generation wRfx: NONREACTIVE

## 2021-11-23 LAB — TYPE AND SCREEN
ABO/RH(D): B POS
Antibody Screen: NEGATIVE

## 2021-11-23 LAB — RPR: RPR Ser Ql: NONREACTIVE

## 2021-11-23 MED ORDER — DIPHENHYDRAMINE HCL 50 MG/ML IJ SOLN: 12.5000 mg | INTRAMUSCULAR | Status: DC | PRN

## 2021-11-23 MED ORDER — LACTATED RINGERS IV SOLN
500.0000 mL | INTRAVENOUS | Status: DC | PRN
  Administered 2021-11-24: 17:00:00 500 mL via INTRAVENOUS

## 2021-11-23 MED ORDER — OXYTOCIN-SODIUM CHLORIDE 30-0.9 UT/500ML-% IV SOLN: 1.0000 m[IU]/min | INTRAVENOUS | Status: DC

## 2021-11-23 MED ORDER — MISOPROSTOL 50MCG HALF TABLET
50.0000 ug | ORAL_TABLET | ORAL | Status: DC
  Administered 2021-11-23 (×2): 50 ug via BUCCAL
  Filled 2021-11-23 (×2): qty 1

## 2021-11-23 MED ORDER — FENTANYL-BUPIVACAINE-NACL 0.5-0.125-0.9 MG/250ML-% EP SOLN
12.0000 mL/h | EPIDURAL | Status: DC | PRN
  Filled 2021-11-23: qty 250

## 2021-11-23 MED ORDER — SOD CITRATE-CITRIC ACID 500-334 MG/5ML PO SOLN: 30.0000 mL | ORAL | Status: DC | PRN

## 2021-11-23 MED ORDER — ZOLPIDEM TARTRATE 5 MG PO TABS: 5.0000 mg | ORAL_TABLET | Freq: Once | ORAL | Status: DC

## 2021-11-23 MED ORDER — PHENYLEPHRINE 40 MCG/ML (10ML) SYRINGE FOR IV PUSH (FOR BLOOD PRESSURE SUPPORT)
80.0000 ug | PREFILLED_SYRINGE | INTRAVENOUS | Status: DC | PRN
  Administered 2021-11-24 (×2): 80 ug via INTRAVENOUS

## 2021-11-23 MED ORDER — EPHEDRINE 5 MG/ML INJ: 10.0000 mg | INTRAVENOUS | Status: DC | PRN

## 2021-11-23 MED ORDER — OXYTOCIN-SODIUM CHLORIDE 30-0.9 UT/500ML-% IV SOLN
1.0000 m[IU]/min | INTRAVENOUS | Status: DC
  Administered 2021-11-23: 14:00:00 1 m[IU]/min via INTRAVENOUS
  Filled 2021-11-23: qty 500

## 2021-11-23 MED ORDER — LACTATED RINGERS IV SOLN
500.0000 mL | Freq: Once | INTRAVENOUS | Status: AC
  Administered 2021-11-23: 10:00:00 500 mL via INTRAVENOUS

## 2021-11-23 MED ORDER — OXYTOCIN BOLUS FROM INFUSION
333.0000 mL | Freq: Once | INTRAVENOUS | Status: AC
  Administered 2021-11-24: 23:00:00 333 mL via INTRAVENOUS

## 2021-11-23 MED ORDER — TERBUTALINE SULFATE 1 MG/ML IJ SOLN
0.2500 mg | Freq: Once | INTRAMUSCULAR | Status: DC | PRN
  Filled 2021-11-23: qty 1

## 2021-11-23 MED ORDER — FENTANYL CITRATE (PF) 100 MCG/2ML IJ SOLN
50.0000 ug | INTRAMUSCULAR | Status: DC | PRN
  Administered 2021-11-23 – 2021-11-24 (×5): 100 ug via INTRAVENOUS
  Filled 2021-11-23 (×6): qty 2

## 2021-11-23 MED ORDER — LACTATED RINGERS IV SOLN: INTRAVENOUS | Status: DC

## 2021-11-23 MED ORDER — ACETAMINOPHEN 325 MG PO TABS: 650.0000 mg | ORAL_TABLET | ORAL | Status: DC | PRN

## 2021-11-23 MED ORDER — LIDOCAINE HCL (PF) 1 % IJ SOLN: 30.0000 mL | INTRAMUSCULAR | Status: DC | PRN

## 2021-11-23 MED ORDER — OXYTOCIN-SODIUM CHLORIDE 30-0.9 UT/500ML-% IV SOLN: 2.5000 [IU]/h | INTRAVENOUS | Status: DC

## 2021-11-23 MED ORDER — TERBUTALINE SULFATE 1 MG/ML IJ SOLN
0.2500 mg | Freq: Once | INTRAMUSCULAR | Status: AC | PRN
  Administered 2021-11-23: 10:00:00 0.25 mg via SUBCUTANEOUS

## 2021-11-23 MED ORDER — PHENYLEPHRINE 40 MCG/ML (10ML) SYRINGE FOR IV PUSH (FOR BLOOD PRESSURE SUPPORT)
80.0000 ug | PREFILLED_SYRINGE | INTRAVENOUS | Status: DC | PRN
  Filled 2021-11-23: qty 10

## 2021-11-23 MED ORDER — MISOPROSTOL 50MCG HALF TABLET
50.0000 ug | ORAL_TABLET | ORAL | Status: DC | PRN
  Filled 2021-11-23: qty 1

## 2021-11-23 MED ORDER — OXYCODONE-ACETAMINOPHEN 5-325 MG PO TABS: 2.0000 | ORAL_TABLET | ORAL | Status: DC | PRN

## 2021-11-23 MED ORDER — ONDANSETRON HCL 4 MG/2ML IJ SOLN
4.0000 mg | Freq: Four times a day (QID) | INTRAMUSCULAR | Status: DC | PRN
  Administered 2021-11-24: 09:00:00 4 mg via INTRAVENOUS
  Filled 2021-11-23: qty 2

## 2021-11-23 MED ORDER — OXYCODONE-ACETAMINOPHEN 5-325 MG PO TABS: 1.0000 | ORAL_TABLET | ORAL | Status: DC | PRN

## 2021-11-23 NOTE — Progress Notes (Signed)
Subjective:    Continues resting. Feels a little more uncomfortable with contractions   Objective:    VS: BP 111/64    Pulse 75    Temp 97.7 F (36.5 C) (Oral)    Resp 16    Ht 5' (1.524 m)    Wt 94.3 kg    LMP 02/17/2021    BMI 40.62 kg/m  FHR : baseline 150 / variability moderate / accelerations present / variable decelerations Toco: contractions every 2-4 minutes / mild Membranes: intact Dilation: 1 Effacement (%): 70 Station: Ballotable Presentation: Vertex Exam by:: Tashima Scarpulla CNM Pitocin 6 mU/min  Assessment/Plan:   30 y.o. G2P0010 [redacted]w[redacted]d  Labor:  Cervical ripening on 6 mU/min of pit.  Fetal Wellbeing:  Category I Pain Control:  IV pain meds I/D:   GBS negative Anticipated MOD:  NSVD  Tina Balm MSN, CNM 11/23/2021 9:21 PM

## 2021-11-23 NOTE — Progress Notes (Signed)
Patient with GDM and orders for ACHS sugar checks. Patient ate half of a sandwich at 0230. RN to check CBG at 0330, patient refused. RN provided education

## 2021-11-23 NOTE — Progress Notes (Signed)
Inpatient Diabetes Program Recommendations  AACE/ADA: New Consensus Statement on Inpatient Glycemic Control (2015)  Target Ranges:  Prepandial:   less than 140 mg/dL      Peak postprandial:   less than 180 mg/dL (1-2 hours)      Critically ill patients:  140 - 180 mg/dL   Lab Results  Component Value Date   HGBA1C 5.7 (H) 06/25/2019    Review of Glycemic Control  Diabetes history: GDMA2 Outpatient Diabetes medications: Metformin 500 mg QHS Current orders for Inpatient glycemic control: None  Received consult for DM evaluation.  IOL today; CBG's ordered.  Will follow trends.  Will continue to follow while inpatient.  Thank you, Dulce Sellar, RN, BSN Diabetes Coordinator Inpatient Diabetes Program [phone] (team pager from 8a-5p)

## 2021-11-23 NOTE — Plan of Care (Signed)

## 2021-11-23 NOTE — Progress Notes (Signed)
Subjective:    Patient up in room. Discussed POC with her. Agrees to blood sugar monitoring with either a continuous monitor if nurse can obtain or finger sticks.   Objective:    VS: BP (!) 106/45    Pulse 76    Temp 98.1 F (36.7 C) (Oral)    Resp 16    Ht 5' (1.524 m)    Wt 94.3 kg    LMP 02/17/2021    BMI 40.62 kg/m  FHR : baseline 145 / variability moderate / accelerations present / variable decelerations Toco: contractions every 2-6 minutes  Membranes: intact Dilation: 1 Effacement (%): 70 Station: -3 Presentation: Vertex Exam by:: Jabez Molner CNM  Assessment/Plan:   30 y.o. G2P0010 [redacted]w[redacted]d here for IOL for GDMA2 on metformin. Patient has refused blood sugar checks until this point.   Labor: S/P 2 doses of cytotec. Will give third dose and evaluate in 4 hours for pitocin or foley bulb.  Preeclampsia:   Denies s/s. BP stable Fetal Wellbeing:  Category I Pain Control:  Labor support without medications I/D:   GBS negative Anticipated MOD:  NSVD  Oley Balm MSN, CNM 11/23/2021 8:40 AM

## 2021-11-23 NOTE — Progress Notes (Addendum)
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 One area of concern reviewed with Dr. Normand Sloop which she believes is unlikely to be a lesion. Patient reports she had not taken Valtrex this pregnancy. She denies having any previous outbreaks and was positive by blood test. Has no current symptoms of vaginal pain, burning or itching. Offered primary c/s vs vaginal birth with risks and benefits of HSV transmission included in counseling.  Patient will continue with labor induction. Consulted Dr. Normand Sloop who is in agreement with plan.  Objective:    VS: BP 125/69    Pulse (!) 102    Temp 97.6 F (36.4 C) (Oral)    Resp 18    Ht 5' (1.524 m)    Wt 94.3 kg    LMP 02/17/2021    BMI 40.62 kg/m  FHR : baseline 135 / variability moderate / accelerations present / absent decelerations Toco: contractions every 4-5 minutes Membranes: intact Dilation: 1 Effacement (%): 70 Station: Ballotable Presentation: Vertex Exam by:: Dyonna Jaspers CNM   Assessment/Plan:   30 y.o. G2P0010 [redacted]w[redacted]d  Labor: Patient declines foley bulb. Will begin pitocin Fetal Wellbeing:  Category I Pain Control:  IV pain meds I/D:   GBS negative.  Anticipated MOD:  NSVD  Oley Balm MSN, CNM 11/23/2021 2:08 PM

## 2021-11-23 NOTE — Progress Notes (Addendum)
Inpatient Diabetes Program Recommendations  ADA Standards of Care 2021 Diabetes in Pregnancy Target Glucose Ranges:  Fasting: 60 - 90 mg/dL Preprandial: 60 - 510 mg/dL 1 hr postprandial: Less than 140mg /dL (from first bite of meal) 2 hr postprandial: Less than 120 mg/dL (from first bit of meal)    Lab Results  Component Value Date   HGBA1C 5.7 (H) 06/25/2019    Review of Glycemic Control  Diabetes history: GDMA2 Outpatient Diabetes medications: Metformin 500 mg QHS Current orders for Inpatient glycemic control: CBG's fasting and TID after meals  Inpatient Diabetes Program Recommendations:    If not eating, please consider Novolog 0-14 units Q4H  Once in active labor if CBG's > 120 x 2 start Endotool  Received page from [place], [place].  Patient is declining finger sticks & requesting CGM.  [name] being induced for GDM.  Spoke with patient at bedside. She confirms above home medication.  She states she has been taking her Metformin as prescribed.  She states she checks her blood sugar and her fasting's are <90 mg/dl and postprandials are < 120 mg/dl.    Explained importance of blood sugar monitoring during labor, impact of uncontrolled blood sugar on infant and possibility of IV insulin with labor.  [name] in agreement with finger sticks.  Will hold off on CGM.   She does plan on breastfeeding.    Will continue to follow while inpatient.  Thank you, [place], RN, BSN Diabetes Coordinator Inpatient Diabetes Program [phone] (team pager from 8a-5p)

## 2021-11-23 NOTE — Progress Notes (Signed)
Subjective:    Patient on right side, resting.   Objective:    VS: BP (!) 106/45    Pulse 76    Temp 98.1 F (36.7 C) (Oral)    Resp 16    Ht 5' (1.524 m)    Wt 94.3 kg    LMP 02/17/2021    BMI 40.62 kg/m  FHR : baseline 135 / variability moderate / accelerations present / variable decelerations Toco: contractions irregular s/p terbutaline   Membranes: intact Dilation: 1 Effacement (%): 70 Station: Ballotable Presentation: Vertex Exam by:: Torie Priebe CNM   Assessment/Plan:   30 y.o. G2P0010 [redacted]w[redacted]d for IOL for GDMA2. Now accepting of finger stick checks for blood sugars.   Labor: Was not given last dose of cytotec at 0840 d/t frequency of contractions. Will begin low dose pitocin. Recheck cervix in 4 hours or PRN.  Fetal Wellbeing:  Category II Repetitive variable decelerations with no late decelerations.  Pain Control:  Labor support without medications I/D:   GBS negative Anticipated MOD:  NSVD  Dr. Normand Sloop updated on plan and reviewed the strip.   Oley Balm MSN, CNM 11/23/2021 10:39 AM

## 2021-11-23 NOTE — Progress Notes (Signed)
Subjective:    Patient requesting fentanyl for pain. Had been sleeping before but felt the last few contractions were intense.   Objective:    VS: BP (!) 121/50    Pulse (!) 207    Temp 97.7 F (36.5 C) (Oral)    Resp 17    Ht 5' (1.524 m)    Wt 94.3 kg    LMP 02/17/2021    BMI 40.62 kg/m  FHR : baseline 155 / variability moderate / accelerations present / variable decelerations Toco: contractions every 2-4 minutes  Membranes: Intact Dilation: 1 Effacement (%): 70 Station: Ballotable Presentation: Vertex Exam by:: Krue Peterka CNM Pitocin 4 mU/min  Assessment/Plan:   30 y.o. G2P0010 [redacted]w[redacted]d here for IOL for GDMA2 s/p 2 doses of cytotec. Third dose held for tachysystole. Low dose pitocin begun at 1400.   Labor:  Continue cervical ripening  . Will hold pitocin at 6 milli-units/min.  Fetal Wellbeing:  Category I Pain Control:  IV pain meds Anticipated MOD:  NSVD  Oley Balm MSN, CNM 11/23/2021 7:14 PM

## 2021-11-23 NOTE — H&P (Signed)
OB ADMISSION/ HISTORY & PHYSICAL:  Admission Date: 11/23/2021 12:03 AM  Admit Diagnosis: Induction of labor  Tina Thornton is a 30 y.o. female G2P0010 [redacted]w[redacted]d presenting for IOL for GDMA2. Endorses active FM, denies LOF, ctxs  and vaginal bleeding.   History of current pregnancy: G2P0010   Primary OB Provider: [facility] Patient entered care with [facility] at 6.1 wks.   EDC 11/30/21 by 6.1 wk U/S, unsure LMP 02/17/21 Anatomy scan:  25.6 wks, complete w/ anterior placenta.   Antenatal testing: for growth d/t GDM started at 33.1 weeks Last evaluation: 37.1  wks  Significant prenatal events:  Anemia of pregnancy HSV GDMA2 on metformin 500mg  with evening meal Patient Active Problem List   Diagnosis Date Noted   Encounter for induction of labor 11/23/2021   Gestational diabetes mellitus (GDM), antepartum 10/13/2021   Migraine without aura and without status migrainosus, not intractable 01/18/2021   Ganglion cyst of dorsum of right wrist 05/14/2020   Elevated liver enzymes 08/12/2019   Chronic nonintractable headache 06/27/2019   Right calf pain 06/28/2017    Prenatal Labs: ABO, Rh:  B positive Antibody:  negative Rubella:   Immune RPR:   NR HBsAg:   NR HIV:   NR GTT: 218 GBS:   negative GC/CHL: negative/negative Genetics: Declined Tdap/influenza vaccines: declined/declined   OB History  Gravida Para Term Preterm AB Living  2       1    SAB IAB Ectopic Multiple Live Births    1          # Outcome Date GA Lbr Len/2nd Weight Sex Delivery Anes PTL Lv  2 Current           1 IAB             Medical / Surgical History: Past medical history:  Past Medical History:  Diagnosis Date   Eczema    Headache    HSV (herpes simplex virus) infection    Known health problems: none     Past surgical history:  Past Surgical History:  Procedure Laterality Date   DENTAL SURGERY     Family History:  Family History  Problem Relation Age of Onset   Arthritis Paternal Grandmother     Headache Neg Hx    Migraines Neg Hx     Social History:  reports that she has never smoked. She has never used smokeless tobacco. She reports that she does not currently use alcohol. She reports that she does not use drugs.  Allergies: Patient has no known allergies.   Current Medications at time of admission:  Prior to Admission medications   Medication Sig Start Date End Date Taking? Authorizing Provider  azelastine (ASTELIN) 0.1 % nasal spray Place 1 spray into both nostrils 2 (two) times daily. Use in each nostril as directed 10/30/20   14/3/21, NP  fluticasone [facility]) 50 MCG/ACT nasal spray Place 2 sprays into both nostrils daily. 10/30/20   14/3/21, NP  hydrocortisone (ANUSOL-HC) 2.5 % rectal cream Place 1 application rectally 2 (two) times daily. 08/12/19   08/14/19, NP  omeprazole (PRILOSEC) 40 MG capsule Take 1 capsule (40 mg total) by mouth daily. 10/30/20   14/3/21, NP  ondansetron (ZOFRAN-ODT) 4 MG disintegrating tablet Take 1 tablet (4 mg total) by mouth every 8 (eight) hours as needed for nausea. 01/18/21   01/20/21, MD    Review of Systems: Constitutional: Negative   HENT: Negative   Eyes: Negative   Respiratory: Negative  Cardiovascular: Negative   Gastrointestinal: Negative  Genitourinary: negative for bloody show, negative for LOF   Musculoskeletal: Negative   Skin: Negative   Neurological: Negative   Endo/Heme/Allergies: Negative   Psychiatric/Behavioral: Negative    Physical Exam: VS: Last menstrual period 02/17/2021. AAO x3, no signs of distress Cardiovascular: RRR Respiratory: Lung fields clear to ausculation GU/GI: Abdomen gravid, non-tender, non-distended, active FM, vertex, EFW 7lbs per Leopold's Extremities: negative edema, negative for pain, tenderness, and cords  Cervical exam:Dilation: 1 Effacement (%): 70 Station: -3 Exam by:: [name] CNM FHR: baseline rate 140 / variability moderate / accelerations present  / absent decelerations TOCO: occasional   Prenatal Transfer Tool  Maternal Diabetes: Yes:  Diabetes Type:  Insulin/Medication controlled metformin 500mg  at evening meal Genetic Screening: Declined Maternal Ultrasounds/Referrals: Normal Fetal Ultrasounds or other Referrals:  Referred to Materal Fetal Medicine  GDMA2 Maternal Substance Abuse:  No Significant Maternal Medications:  metformin, valtrex Significant Maternal Lab Results: Group B Strep negative    Assessment: 30 y.o. G2P0010 [redacted]w[redacted]d IOL for GDMA2, on metformin 500mg  at evening meal.   Cytotec IOL FHR category 1 GBS negative Pain management plan: epidural, IV pain meds prn   Plan:  Admit to L&D Routine admission orders Epidural PRN Cytotec [redacted]w[redacted]d buccal HSV: sterile speculum exam, on valtrex Dr notified of admission and plan of care  Mykaylah Ballman MSN, CNM 11/23/2021 1:02 AM

## 2021-11-24 ENCOUNTER — Inpatient Hospital Stay (HOSPITAL_COMMUNITY): Payer: Federal, State, Local not specified - PPO | Admitting: Anesthesiology

## 2021-11-24 ENCOUNTER — Encounter (HOSPITAL_COMMUNITY): Payer: Self-pay | Admitting: Obstetrics & Gynecology

## 2021-11-24 DIAGNOSIS — Z3A39 39 weeks gestation of pregnancy: Secondary | ICD-10-CM | POA: Diagnosis not present

## 2021-11-24 DIAGNOSIS — Z3490 Encounter for supervision of normal pregnancy, unspecified, unspecified trimester: Secondary | ICD-10-CM | POA: Diagnosis not present

## 2021-11-24 DIAGNOSIS — O2492 Unspecified diabetes mellitus in childbirth: Secondary | ICD-10-CM | POA: Diagnosis not present

## 2021-11-24 LAB — GLUCOSE, CAPILLARY
Glucose-Capillary: 156 mg/dL — ABNORMAL HIGH (ref 70–99)
Glucose-Capillary: 81 mg/dL (ref 70–99)
Glucose-Capillary: 82 mg/dL (ref 70–99)
Glucose-Capillary: 85 mg/dL (ref 70–99)
Glucose-Capillary: 91 mg/dL (ref 70–99)
Glucose-Capillary: 94 mg/dL (ref 70–99)

## 2021-11-24 MED ORDER — LIDOCAINE HCL (PF) 1 % IJ SOLN
INTRAMUSCULAR | Status: DC | PRN
  Administered 2021-11-24: 5 mL via EPIDURAL

## 2021-11-24 MED ORDER — TERBUTALINE SULFATE 1 MG/ML IJ SOLN
0.2500 mg | Freq: Once | INTRAMUSCULAR | Status: DC | PRN
  Filled 2021-11-24: qty 1

## 2021-11-24 MED ORDER — TERBUTALINE SULFATE 1 MG/ML IJ SOLN
0.2500 mg | Freq: Once | INTRAMUSCULAR | Status: AC | PRN
  Administered 2021-11-24: 18:00:00 0.25 mg via SUBCUTANEOUS

## 2021-11-24 MED ORDER — OXYTOCIN-SODIUM CHLORIDE 30-0.9 UT/500ML-% IV SOLN
1.0000 m[IU]/min | INTRAVENOUS | Status: DC
  Administered 2021-11-24: 12:00:00 6 m[IU]/min via INTRAVENOUS
  Administered 2021-11-24: 20:00:00 2 m[IU]/min via INTRAVENOUS

## 2021-11-24 MED ORDER — FENTANYL-BUPIVACAINE-NACL 0.5-0.125-0.9 MG/250ML-% EP SOLN
EPIDURAL | Status: DC | PRN
  Administered 2021-11-24: 12 mL/h via EPIDURAL

## 2021-11-24 MED ORDER — OXYTOCIN-SODIUM CHLORIDE 30-0.9 UT/500ML-% IV SOLN
1.0000 m[IU]/min | INTRAVENOUS | Status: DC
  Administered 2021-11-24: 08:00:00 1 m[IU]/min via INTRAVENOUS

## 2021-11-24 NOTE — Progress Notes (Signed)
° °  Objective:    VS: BP 130/67    Pulse 80    Temp 98.3 F (36.8 C) (Oral)    Resp 16    Ht 5' (1.524 m)    Wt 94.3 kg    LMP 02/17/2021    BMI 40.62 kg/m  FHR : baseline 150 / variability moderate / accelerations present / no decelerations Toco: contractions every 4-5 minutes Membranes: intact Dilation: 1.5 Effacement (%): 80 Station: -3 Presentation: Vertex Exam by:: Mellody Dance, RN Pitocin 2 mU/min  Assessment/Plan:   30 y.o. G2P0010 [redacted]w[redacted]d here for IOL for GDMA2. Patient in agreement to restart pitocin.   Labor:  Cervical ripening for IOL.  Preeclampsia:   denies signs or symptoms.  Fetal Wellbeing:  Category I Pain Control:  Labor support without medications I/D:   GBS negative. Anticipated MOD:  NSVD  POC reviewed at sign out with Dr. Montez Hageman MSN, CNM 11/24/2021 10:47 AM

## 2021-11-24 NOTE — Progress Notes (Signed)
Subjective:    Discussion with patient about foley bulb placement. Patient now in agreement.   Objective:    VS: BP 130/67    Pulse 80    Temp 98.3 F (36.8 C) (Oral)    Resp 16    Ht 5' (1.524 m)    Wt 94.3 kg    LMP 02/17/2021    BMI 40.62 kg/m  FHR : baseline 150 / variability >5 / accelerations present / variable decelerations Toco: contractions every 2-3 minutes  Membranes: intact Dilation: 1.5 Effacement (%): 80 Station: -3 Presentation: Vertex Exam by:: Mellody Dance, RN Pitocin 5 mU/min  Assessment/Plan:   30 y.o. G2P0010 [redacted]w[redacted]d here for IOL for GDMA2. Unable to place foley bulb after multiple attempts. Patient requesting break from pitocin. Will stop for now.   Labor: Continue cervical ripening for IOL. Anticipate AROM later today Fetal Wellbeing:  Category II minimal variability likely d/t last dose of fentanyl. Will space out next dose until moderate variability Pain Control:  IV pain meds Anticipated MOD:  NSVD  Dr. Normand Sloop updated with POC  Oley Balm MSN, CNM 11/24/2021 10:40 AM

## 2021-11-24 NOTE — Progress Notes (Signed)
°  Called by RN for FHR baseline increase to 160s  Objective:    VS: BP (!) 106/45    Pulse 66    Temp 98 F (36.7 C) (Axillary)    Resp 17    Ht 5' (1.524 m)    Wt 94.3 kg    LMP 02/17/2021    BMI 40.62 kg/m  FHR : baseline 160 / variability moderate / accelerations present / variable decelerations Toco: contractions every 2-3 minutes  Membranes: intact Dilation: 1 Effacement (%): 70 Station: Ballotable Presentation: Vertex Exam by:: Mattelyn Imhoff CNM Pitocin 5 mU/min  Assessment/Plan:   30 y.o. G2P0010 [redacted]w[redacted]d for IOL for GDMA2. Called by RN for FHR increase to 160s.  Labor: Give fluid bolus.  Fetal Wellbeing:  Category I Anticipated MOD:  NSVD  Oley Balm MSN, CNM 11/24/2021 3:43 AM

## 2021-11-24 NOTE — Progress Notes (Signed)
Subjective:    Pt feeling pressure with ctxs. Pushing with good maternal effort and effectively.   Objective:    VS: BP (!) 91/45    Pulse (!) 118    Temp 98.5 F (36.9 C) (Oral)    Resp 16    Ht 5' (1.524 m)    Wt 94.3 kg    LMP 02/17/2021    SpO2 100%    BMI 40.62 kg/m  FHR : baseline 155 / variability moderate / accelerations present / variables decelerations with pushing Toco: contractions every 4 minutes  Membranes: SROM 1510 Dilation: 10 Effacement (%): 100 Station: Plus 2 Presentation: Vertex Exam by:: Selena Batten, CNM Pitocin 4 mU/min  Assessment/Plan:   30 y.o. G2P0010 [redacted]w[redacted]d IOL for GDMA2  Labor: Progressing normally, S/P cytotec x 2, pitocin Preeclampsia:  no signs or symptoms of toxicity Fetal Wellbeing:  Category I Pain Control:  Epidural I/D:   GBS negative Anticipated MOD:  NSVD Dr Richardson Dopp updated on pt status and POC  Tina Munch Sartaj Hoskin MSN, CNM 11/24/2021 8:04 PM

## 2021-11-24 NOTE — Progress Notes (Signed)
Subjective:    Called to room to assess for prolonged deceleration to the 70s at 1735. Resolved to 150s after ten minutes with position change and fluid bolus. Pitocin was stopped and terbutaline was given during the decel.  52- Dr. Su Hilt to room to assess patient. FSE and IUPC placed.   Objective:    VS: BP (!) 91/45    Pulse (!) 118    Temp 98.5 F (36.9 C) (Oral)    Resp 16    Ht 5' (1.524 m)    Wt 94.3 kg    LMP 02/17/2021    SpO2 100%    BMI 40.62 kg/m  FHR : baseline 160/ variability moderate / accelerations present / variable decelerations Toco: contractions every 3-4 minutes  Membranes: SROM Dilation: Lip/rim Effacement (%): 100 Station: 0 Presentation: Vertex Exam by:: Dierdre Harness CNM   Assessment/Plan:   30 y.o. G2P0010 105w1d  Labor: Anticipate patient will begin pushing soon.  Fetal Wellbeing:  Category I Pain Control:  Epidural I/D:   GBS neg Anticipated MOD:  NSVD  Oley Balm MSN, CNM 11/24/2021 6:51 PM

## 2021-11-24 NOTE — Progress Notes (Signed)
Subjective:    Resting in bed  Objective:    VS: BP (!) 106/45    Pulse 66    Temp 98 F (36.7 C) (Axillary)    Resp 17    Ht 5' (1.524 m)    Wt 94.3 kg    LMP 02/17/2021    BMI 40.62 kg/m  FHR : baseline 160 / variability moderate / accelerations present / variable decelerations Toco: contractions every 2-3 minutes, mild Membranes: intact Dilation: 1 Effacement (%): 70 Station: Ballotable Presentation: Vertex Exam by:: Misbah Hornaday CNM Pitocin 5 mU/min  Assessment/Plan:   30 y.o. G2P0010 [redacted]w[redacted]d  Labor:  Continues with cervical ripening Fetal Wellbeing:  Category I Pain Control:  IV pain meds I/D:   GBS negative Anticipated MOD:  NSVD  Oley Balm MSN, CNM 11/24/2021 3:39 AM

## 2021-11-24 NOTE — Anesthesia Procedure Notes (Signed)
Epidural Patient location during procedure: OB Start time: 11/24/2021 5:12 PM End time: 11/24/2021 5:27 PM  Staffing Anesthesiologist: Trevor Iha, MD Performed: anesthesiologist   Preanesthetic Checklist Completed: patient identified, IV checked, site marked, risks and benefits discussed, surgical consent, monitors and equipment checked, pre-op evaluation and timeout performed  Epidural Patient position: sitting Prep: DuraPrep and site prepped and draped Patient monitoring: continuous pulse ox and blood pressure Approach: midline Location: L3-L4 Injection technique: LOR air  Needle:  Needle type: Tuohy  Needle gauge: 17 G Needle length: 9 cm and 9 Needle insertion depth: 6 cm Catheter type: closed end flexible Catheter size: 19 Gauge Catheter at skin depth: 12 cm Test dose: negative  Assessment Events: blood not aspirated, injection not painful, no injection resistance, no paresthesia and negative IV test  Additional Notes Patient identified. Risks/Benefits/Options discussed with patient including but not limited to bleeding, infection, nerve damage, paralysis, failed block, incomplete pain control, headache, blood pressure changes, nausea, vomiting, reactions to medication both or allergic, itching and postpartum back pain. Confirmed with bedside nurse the patient's most recent platelet count. Confirmed with patient that they are not currently taking any anticoagulation, have any bleeding history or any family history of bleeding disorders. Patient expressed understanding and wished to proceed. All questions were answered. Sterile technique was used throughout the entire procedure. Please see nursing notes for vital signs. Test dose was given through epidural needle and negative prior to continuing to dose epidural or start infusion. Warning signs of high block given to the patient including shortness of breath, tingling/numbness in hands, complete motor block, or any  concerning symptoms with instructions to call for help. Patient was given instructions on fall risk and not to get out of bed. All questions and concerns addressed with instructions to call with any issues.  1 Attempt (S) . Patient tolerated procedure well.

## 2021-11-24 NOTE — Anesthesia Preprocedure Evaluation (Signed)
Anesthesia Evaluation  Patient identified by MRN, date of birth, ID band Patient awake    Reviewed: Allergy & Precautions, NPO status , Patient's Chart, lab work & pertinent test results  Airway Mallampati: III  TM Distance: >3 FB Neck ROM: Full    Dental no notable dental hx. (+) Teeth Intact, Dental Advisory Given   Pulmonary neg pulmonary ROS,    Pulmonary exam normal breath sounds clear to auscultation       Cardiovascular Exercise Tolerance: Good Normal cardiovascular exam Rhythm:Regular Rate:Normal     Neuro/Psych  Headaches, negative psych ROS   GI/Hepatic Neg liver ROS,   Endo/Other  diabetes  Renal/GU      Musculoskeletal   Abdominal (+) + obese (BMI 40.62),   Peds  Hematology Lab Results      Component                Value               Date                      WBC                      7.6                 11/23/2021                HGB                      11.7 (L)            11/23/2021                HCT                      34.8 (L)            11/23/2021                   PLT                      181                 11/23/2021              Anesthesia Other Findings   Reproductive/Obstetrics (+) Pregnancy                             Anesthesia Physical Anesthesia Plan  ASA: 3  Anesthesia Plan: Epidural   Post-op Pain Management:    Induction:   PONV Risk Score and Plan:   Airway Management Planned:   Additional Equipment:   Intra-op Plan:   Post-operative Plan:   Informed Consent: I have reviewed the patients History and Physical, chart, labs and discussed the procedure including the risks, benefits and alternatives for the proposed anesthesia with the patient or authorized representative who has indicated his/her understanding and acceptance.       Plan Discussed with:   Anesthesia Plan Comments: (39.1 Wk G2P0 w GDM BMI 40.62  for LEA)         Anesthesia Quick Evaluation

## 2021-11-24 NOTE — Lactation Note (Signed)
This note was copied from a baby's chart. Lactation Consultation Note  Patient Name: Tina Thornton [id number] Date: 11/24/2021 Reason for consult: L&D Initial assessment Age:31 hours Mom was doing skin to skin with infant when [facility] entered the room. Mom attempted to latch infant on her left breast using the football hold position, infant would not latch in L&D. Infant only held nipple in mouth and would not suckle at the breast. Nor would infant suckle on LC's glove finger at this time. Mom will do lots of skin to skin and continue to work towards latching infant on the breast. Mom knows to call RN/LC for further latch assistance on MBU. Mom could benefit from hand pump on MBU to pre-pump breast prior to latching infant see Latch =score. LC congratulated mom on the birth of her daughter.  Maternal Data    Feeding Mother's Current Feeding Choice: Breast Milk  LATCH Score Latch: Too sleepy or reluctant, no latch achieved, no sucking elicited.  Audible Swallowing: None  Type of Nipple: Flat  Comfort (Breast/Nipple): Soft / non-tender  Hold (Positioning): Assistance needed to correctly position infant at breast and maintain latch.  LATCH Score: 4   Lactation Tools Discussed/Used    Interventions Interventions: Assisted with latch;Skin to skin;Breast compression;Adjust position;Support pillows;Education  Discharge    Consult Status Consult Status: Follow-up from L&D    Danelle Earthly 11/24/2021, 11:21 PM

## 2021-11-24 NOTE — Progress Notes (Signed)
Subjective:    Patient SROM, clear fluid while up in the bathroom. Requesting pain medication.   Objective:    VS: BP 122/69    Pulse 62    Temp 98.5 F (36.9 C) (Oral)    Resp 16    Ht 5' (1.524 m)    Wt 94.3 kg    LMP 02/17/2021    BMI 40.62 kg/m  FHR : baseline 150 / variability moderate / accelerations present / no decelerations Toco: contractions every 2-3 minutes  Membranes: SROM, clear Dilation: 2 Effacement (%): 100 Station: -1 Presentation: Vertex Exam by:: Eliu Batch Slaughterback CNM Pitocin 12 mU/min  Assessment/Plan:   30 y.o. G2P0010 [redacted]w[redacted]d  Labor:  Progressing with pitocin. Will recheck cervix in 4 hours or prn Fetal Wellbeing:  Category I Pain Control:  IV pain meds I/D:   GBS negative Anticipated MOD:  NSVD  Dr. Su Hilt updated  Tina Balm MSN, CNM 11/24/2021 3:33 PM

## 2021-11-25 LAB — CBC
HCT: 31.8 % — ABNORMAL LOW (ref 36.0–46.0)
Hemoglobin: 10.3 g/dL — ABNORMAL LOW (ref 12.0–15.0)
MCH: 29.9 pg (ref 26.0–34.0)
MCHC: 32.4 g/dL (ref 30.0–36.0)
MCV: 92.2 fL (ref 80.0–100.0)
Platelets: 168 10*3/uL (ref 150–400)
RBC: 3.45 MIL/uL — ABNORMAL LOW (ref 3.87–5.11)
RDW: 13.4 % (ref 11.5–15.5)
WBC: 16.2 10*3/uL — ABNORMAL HIGH (ref 4.0–10.5)
nRBC: 0 % (ref 0.0–0.2)

## 2021-11-25 MED ORDER — ZOLPIDEM TARTRATE 5 MG PO TABS: 5.0000 mg | ORAL_TABLET | Freq: Every evening | ORAL | Status: DC | PRN

## 2021-11-25 MED ORDER — PRENATAL MULTIVITAMIN CH
1.0000 | ORAL_TABLET | Freq: Every day | ORAL | Status: DC
  Administered 2021-11-25 – 2021-11-26 (×2): 1 via ORAL
  Filled 2021-11-25 (×2): qty 1

## 2021-11-25 MED ORDER — TETANUS-DIPHTH-ACELL PERTUSSIS 5-2.5-18.5 LF-MCG/0.5 IM SUSY: 0.5000 mL | PREFILLED_SYRINGE | Freq: Once | INTRAMUSCULAR | Status: DC

## 2021-11-25 MED ORDER — BENZOCAINE-MENTHOL 20-0.5 % EX AERO
1.0000 "application " | INHALATION_SPRAY | CUTANEOUS | Status: DC | PRN
  Filled 2021-11-25: qty 56

## 2021-11-25 MED ORDER — ACETAMINOPHEN 325 MG PO TABS: 650.0000 mg | ORAL_TABLET | ORAL | Status: DC | PRN

## 2021-11-25 MED ORDER — OXYCODONE HCL 5 MG PO TABS: 10.0000 mg | ORAL_TABLET | ORAL | Status: DC | PRN

## 2021-11-25 MED ORDER — WITCH HAZEL-GLYCERIN EX PADS: 1.0000 "application " | MEDICATED_PAD | CUTANEOUS | Status: DC | PRN

## 2021-11-25 MED ORDER — DIPHENHYDRAMINE HCL 25 MG PO CAPS: 25.0000 mg | ORAL_CAPSULE | Freq: Four times a day (QID) | ORAL | Status: DC | PRN

## 2021-11-25 MED ORDER — COCONUT OIL OIL: 1.0000 "application " | TOPICAL_OIL | Status: DC | PRN

## 2021-11-25 MED ORDER — IBUPROFEN 600 MG PO TABS
600.0000 mg | ORAL_TABLET | Freq: Four times a day (QID) | ORAL | Status: DC
  Administered 2021-11-25 – 2021-11-26 (×5): 600 mg via ORAL
  Filled 2021-11-25 (×6): qty 1

## 2021-11-25 MED ORDER — DIBUCAINE (PERIANAL) 1 % EX OINT: 1.0000 "application " | TOPICAL_OINTMENT | CUTANEOUS | Status: DC | PRN

## 2021-11-25 MED ORDER — ONDANSETRON HCL 4 MG PO TABS: 4.0000 mg | ORAL_TABLET | ORAL | Status: DC | PRN

## 2021-11-25 MED ORDER — OXYCODONE HCL 5 MG PO TABS: 5.0000 mg | ORAL_TABLET | ORAL | Status: DC | PRN

## 2021-11-25 MED ORDER — SENNOSIDES-DOCUSATE SODIUM 8.6-50 MG PO TABS
2.0000 | ORAL_TABLET | Freq: Every day | ORAL | Status: DC
  Administered 2021-11-25: 12:00:00 2 via ORAL
  Filled 2021-11-25 (×3): qty 2

## 2021-11-25 MED ORDER — SIMETHICONE 80 MG PO CHEW: 80.0000 mg | CHEWABLE_TABLET | ORAL | Status: DC | PRN

## 2021-11-25 MED ORDER — ONDANSETRON HCL 4 MG/2ML IJ SOLN: 4.0000 mg | INTRAMUSCULAR | Status: DC | PRN

## 2021-11-25 NOTE — Progress Notes (Signed)
PPD# 1 SVD w/ 2nd degree laceration Information for the patient's newborn:  Lilas, Diefendorf [[id number]]  female   Baby Name Jerrell Belfast    S:   Reports feeling "good" Tolerating PO fluid and solids No nausea or vomiting Bleeding is light Pain controlled with acetaminophen and ibuprofen (OTC) Up ad lib / ambulatory / voiding w/o difficulty Feeding: Bottle and Breast    O:   VS: BP (!) 108/53 (BP Location: Right Arm)    Pulse 87    Temp 98.5 F (36.9 C) (Oral)    Resp 18    Ht 5' (1.524 m)    Wt 94.3 kg    LMP 02/17/2021    SpO2 99%    Breastfeeding Unknown    BMI 40.62 kg/m   LABS:  Recent Labs    11/23/21 0224 11/25/21 0343  WBC 7.6 16.2*  HGB 11.7* 10.3*  PLT 181 168   Blood type: --/--/B POS (12/27 0108) Rubella: Immune (06/01 0000)                      I&O: Intake/Output      12/28 0701 12/29 0700 12/29 0701 12/30 0700   Urine (mL/kg/hr) 150 (0.1)    Blood 200    Total Output 350    Net -350           Physical Exam: Alert and oriented X3 Lungs: Clear and unlabored Heart: regular rate and rhythm / no mumurs Abdomen: soft, non-tender, non-distended  Fundus: firm, non-tender, U/1 Perineum: 2nd degree well approximated Lochia: light Extremities: generalized edema, no calf pain, tenderness, or cords    A:  PPD # 1  Normal exam  P:  Routine post partum orders Anticipate D/C on 11/26/21   Plan reviewed w/ Dr. Larena Sox, MSN, CNM 11/25/2021, 12:06 PM

## 2021-11-25 NOTE — Lactation Note (Signed)
This note was copied from a baby's chart. Lactation Consultation Note  Patient Name: Tina Thornton [id number] Date: 11/25/2021 Reason for consult: Difficult latch;1st time breastfeeding;Mother's request Age:31 hours  P1, Unwrapped baby for feeding and placed STS. Had mother prepump with hand pump to help evert nipples. Mother would like to exclusively breastfeed but has agreed at this time to formula supplementation to difficulty latching. Mother states she nipples usually are more evert. Demonstrated how to perform reverse pressure softening. Assisted with latching.  Baby took a few sucks on L breast in football hold.  Encouraged mother to support breast from underneath and not pull breast tissue away from infant's nose which pulls nipple out of infant's mouth. Left baby STS on mother's chest.   Maternal Data Has patient been taught Hand Expression?: Yes Does the patient have breastfeeding experience prior to this delivery?: No  Feeding Mother's Current Feeding Choice: Breast Milk and Formula Nipple Type: Slow - flow  LATCH Score Latch: Repeated attempts needed to sustain latch, nipple held in mouth throughout feeding, stimulation needed to elicit sucking reflex.  Audible Swallowing: None  Type of Nipple: Flat  Comfort (Breast/Nipple): Soft / non-tender  Hold (Positioning): Assistance needed to correctly position infant at breast and maintain latch.  LATCH Score: 5   Lactation Tools Discussed/Used Tools: Pump Breast pump type: Manual Reason for Pumping: help evert nipple  Interventions Interventions: Assisted with latch;Skin to skin;Hand express;Pre-pump if needed;Adjust position;Support pillows;Hand pump;Education  Discharge    Consult Status Consult Status: Follow-up Date: 11/25/21 Follow-up type: In-patient    [name] [facility] 11/25/2021, 7:33 AM

## 2021-11-25 NOTE — Lactation Note (Signed)
This note was copied from a baby's chart. Lactation Consultation Note  Patient Name: Girl Embrie Mikkelsen [id number] Date: 11/25/2021 Reason for consult: Initial assessment;Term;Primapara;1st time breastfeeding Age:31 hours   Initial Lactation Consult:  Mother requested lactation assistance.  I arrived within 5 minutes after her request and she was asleep.  RN back in room for another fundal assessment and mother wanted to wait until the next feeding for assistance.  Her current feeding preference is breast, however, she has already asked for formula stating her baby "was hungry."  Baby "Aurora" consumed 10 mls approximately one hour ago and was not showing any feeding cues.  Will return when mother calls for assistance.     Maternal Data Has patient been taught Hand Expression?: Yes Does the patient have breastfeeding experience prior to this delivery?: No  Feeding Mother's Current Feeding Choice: Breast Milk and Formula (Mother states her goal is to exclusively breast feed) Nipple Type: Slow - flow  LATCH Score                    Lactation Tools Discussed/Used    Interventions Interventions: [facility] Services brochure  Discharge    Consult Status Consult Status: Follow-up Date: 11/26/21 Follow-up type: In-patient    Temperance Kelemen R Tamasha Laplante 11/25/2021, 5:13 AM

## 2021-11-25 NOTE — Anesthesia Postprocedure Evaluation (Signed)
Anesthesia Post Note  Patient: Tina Thornton  Procedure(s) Performed: AN AD HOC LABOR EPIDURAL     Patient location during evaluation: Mother Baby Anesthesia Type: Epidural Level of consciousness: awake Pain management: satisfactory to patient Vital Signs Assessment: post-procedure vital signs reviewed and stable Respiratory status: spontaneous breathing Cardiovascular status: stable Anesthetic complications: no   No notable events documented.  Last Vitals:  Vitals:   11/25/21 0748 11/25/21 1212  BP: (!) 108/53 104/71  Pulse: 87 79  Resp: 18 18  Temp: 36.9 C 36.6 C  SpO2: 99% 99%    Last Pain:  Vitals:   11/25/21 1213  TempSrc:   PainSc: 0-No pain   Pain Goal: Patients Stated Pain Goal: 0 (11/24/21 1700)              Epidural/Spinal Function Cutaneous sensation: Normal sensation (11/25/21 1213)  Cephus Shelling

## 2021-11-26 MED ORDER — IBUPROFEN 600 MG PO TABS
600.0000 mg | ORAL_TABLET | Freq: Four times a day (QID) | ORAL | 0 refills | Status: DC
Start: 2021-11-26 — End: 2023-04-19

## 2021-11-26 NOTE — Discharge Summary (Signed)
SVD OB Discharge Summary     Patient Name: Tina Thornton DOB: Dec 09, 1990 MRN: [id number]  Date of admission: 11/23/2021 Delivering MD: Johney Frame B  Date of delivery: 11/24/2021 Type of delivery: SVD  Newborn Data: Sex: Baby female Live born female  Birth Weight: 6 lb 7 oz (2920 g) APGAR: 7, 9  Newborn Delivery   Birth date/time: 11/24/2021 22:22:00 Delivery type: Vaginal, Spontaneous      Feeding: breast and bottle Infant being discharge to home with mother in stable condition.   Admitting diagnosis: Encounter for induction of labor [Z34.90] Intrauterine pregnancy: [redacted]w[redacted]d     Secondary diagnosis:  Principal Problem:   Encounter for induction of labor Active Problems:   SVD (spontaneous vaginal delivery)   Normal postpartum course                                Complications: None                                                              Intrapartum Procedures: spontaneous vaginal delivery Postpartum Procedures: none Complications-Operative and Postpartum:  2nd degree perineal laceration Augmentation: Pitocin and Cytotec   History of Present Illness: Tina Thornton is a 30 y.o. female, G2P1011, who presents at [redacted]w[redacted]d weeks gestation. The patient has been followed at  [facility]  Her pregnancy has been complicated by:  Patient Active Problem List   Diagnosis Date Noted   Normal postpartum course 11/26/2021   SVD (spontaneous vaginal delivery) 11/24/2021   Encounter for induction of labor 11/23/2021   Gestational diabetes mellitus (GDM), antepartum 10/13/2021   Chronic nonintractable headache 06/27/2019     Active Ambulatory Problems    Diagnosis Date Noted   Chronic nonintractable headache 06/27/2019   Gestational diabetes mellitus (GDM), antepartum 10/13/2021   Resolved Ambulatory Problems    Diagnosis Date Noted   Right calf pain 06/28/2017   Elevated liver enzymes 08/12/2019   Ganglion cyst of  dorsum of right wrist 05/14/2020   Migraine without aura and without status migrainosus, not intractable 01/18/2021   Past Medical History:  Diagnosis Date   Anemia    Diabetes mellitus without complication (HCC)    Eczema    Headache    HSV (herpes simplex virus) infection    Known health problems: none    Vaginal Pap smear, abnormal      Hospital course:  Induction of Labor With Vaginal Delivery   30 y.o. yo G2P1011 at [redacted]w[redacted]d was admitted to the hospital 11/23/2021 for induction of labor.  Indication for induction: A2 DM.  Patient had an uncomplicated labor course as follows: Membrane Rupture Time/Date: 3:10 PM ,11/24/2021   Delivery Method:Vaginal, Spontaneous  Episiotomy: None  Lacerations:  2nd degree;Perineal  Details of delivery can be found in separate delivery note.  Patient had a routine postpartum course. Patient is discharged home 11/26/21.  Newborn Data: Birth date:11/24/2021  Birth time:10:22 PM  Gender:Female  Living status:Living  Apgars:7 ,9  Weight:2920 g  Postpartum Day # 2 : S/P NSVD due to pt was admitted on 12/27 for IOL for GDMA2 on metformin 500mg  nightly, pt since PP CBG WNL, stopped metformin PP will f/u 6 week PP for 2H  GTT to check for overt DM, pt has h/o HSV no lesion during admission, pt progressed with cytotec and pitocn to SVD on 12/28 @ 2222 over 2nd degree repaired laceration, ebl of 200mg , hgb drop of 11.7-10.3, pt stable. Patient up ad lib, denies syncope or dizziness. Reports consuming regular diet without issues and denies N/V. Patient reports 0 bowel movement + passing flatus.  Denies issues with urination and reports bleeding is "lighter."  Patient is breast and bottle feeding and reports going well.  Desires undecided for postpartum contraception.  Pain is being appropriately managed with use of po meds.   Physical exam  Vitals:   11/25/21 0748 11/25/21 1212 11/25/21 2100 11/26/21 0421  BP: (!) 108/53 104/71 108/67 (!) 101/59  Pulse: 87 79  68 90  Resp: 18 18 18 18   Temp: 98.5 F (36.9 C) 97.8 F (36.6 C) 98 F (36.7 C) 97.7 F (36.5 C)  TempSrc: Oral Oral Oral Oral  SpO2: 99% 99% 98% 100%  Weight:      Height:       General: alert, cooperative, and no distress Lochia: appropriate Uterine Fundus: firm Perineum: approximate DVT Evaluation: No evidence of DVT seen on physical exam. Negative Homan's sign. No cords or calf tenderness. No significant calf/ankle edema.  Labs: Lab Results  Component Value Date   WBC 16.2 (H) 11/25/2021   HGB 10.3 (L) 11/25/2021   HCT 31.8 (L) 11/25/2021   MCV 92.2 11/25/2021   PLT 168 11/25/2021   CMP Latest Ref Rng & Units 03/31/2021  Glucose 70 - 99 mg/dL 11/27/2021)  BUN 6 - 20 mg/dL 14  Creatinine 05/31/2021 - 259(D mg/dL 6.38  Sodium 7.56 - 4.33 mmol/L 136  Potassium 3.5 - 5.1 mmol/L 3.7  Chloride 98 - 111 mmol/L 105  CO2 22 - 32 mmol/L 23  Calcium 8.9 - 10.3 mg/dL 9.3  Total Protein 6.5 - 8.1 g/dL 6.8  Total Bilirubin 0.3 - 1.2 mg/dL 0.5  Alkaline Phos 38 - 126 U/L 56  AST 15 - 41 U/L 21  ALT 0 - 44 U/L 27    Date of discharge: 11/26/2021 Discharge Diagnoses: Term Pregnancy-delivered Discharge instruction: per After Visit Summary and "Baby and Me Booklet".  After visit meds:   Activity:           unrestricted and pelvic rest Advance as tolerated. Pelvic rest for 6 weeks.  Diet:                routine Medications: PNV and Ibuprofen Postpartum contraception: Undecided Condition:  Pt discharge to home with baby in stable  Meds: Allergies as of 11/26/2021   No Known Allergies      Medication List     STOP taking these medications    metFORMIN 500 MG tablet Commonly known as: GLUCOPHAGE   ondansetron 4 MG disintegrating tablet Commonly known as: ZOFRAN-ODT       TAKE these medications    ferrous sulfate 325 (65 FE) MG tablet Take 325 mg by mouth daily.   ibuprofen 600 MG tablet Commonly known as: ADVIL Take 1 tablet (600 mg total) by mouth every 6 (six)  hours.   prenatal multivitamin Tabs tablet Take 1 tablet by mouth daily at 12 noon.        Discharge Follow Up:   Follow-up Information     [facility]. Schedule an appointment as soon as possible for a visit in 6 week(s).   Specialty: Obstetrics and Gynecology Contact information: [address] [place] [address] [phone]                 [place], NP-C, CNM 11/26/2021, 8:40 AM  [place], FNP

## 2021-11-26 NOTE — Lactation Note (Signed)
This note was copied from a baby's chart. Lactation Consultation Note  Patient Name: Girl Mariaisabel Bodiford GGEZM'O Date: 11/26/2021 Reason for consult: Follow-up assessment Age:31 hours  P1, Mother states she would like to breastfeed but has primarily with formula feeding. Mother states her nipples are usually evert but currently nipples are flat.  Prepumping has not helped with eversion. Suggest calling the next time baby cues for [facility] to assist with latching.   Maternal Data Has patient been taught Hand Expression?: Yes Does the patient have breastfeeding experience prior to this delivery?: No  Feeding Mother's Current Feeding Choice: Breast Milk and Formula Nipple Type: Slow - flow   Lactation Tools Discussed/Used Tools: Pump Breast pump type: Manual  Interventions Interventions: Breast feeding basics reviewed;Hand pump;Education  Discharge    Consult Status Consult Status: Follow-up Date: 11/27/21 Follow-up type: In-patient    [name] [facility] 11/26/2021, 9:00 AM

## 2021-12-07 ENCOUNTER — Telehealth (HOSPITAL_COMMUNITY): Payer: Self-pay | Admitting: *Deleted

## 2021-12-07 NOTE — Telephone Encounter (Signed)
Attempted hospital discharge follow-up call. No answer received. Erline Levine, RN, 11/1021, [phone]

## 2021-12-14 DIAGNOSIS — Z23 Encounter for immunization: Secondary | ICD-10-CM | POA: Diagnosis not present

## 2021-12-14 DIAGNOSIS — Z20822 Contact with and (suspected) exposure to covid-19: Secondary | ICD-10-CM | POA: Diagnosis not present

## 2021-12-20 NOTE — Progress Notes (Signed)
Established Patient Office Visit  Subjective:  Patient ID: Tina Thornton, female    DOB: [birth year]  Age: 31 y.o. MRN: [id number]  CC:  Chief Complaint  Patient presents with   Follow-up    Patient states she test positive for covid last Wednesday. Per patient she needs a RTW note.    HPI Tina Thornton presents for follow up  Positive for covid on Wednesday Has improved. Symptoms resolved. Occ cough, otherwise no fevers, shob, doe, chest pain, nvd  Needs rtw note for her employer  Past Medical History:  Diagnosis Date   Anemia    Diabetes mellitus without complication (HCC)    GDM   Eczema    Headache    HSV (herpes simplex virus) infection    HSV 1 & 2   Known health problems: none    Vaginal Pap smear, abnormal    ASCUS HPV+    Past Surgical History:  Procedure Laterality Date   DENTAL SURGERY      Family History  Problem Relation Age of Onset   Arthritis Paternal Grandmother    Headache Neg Hx    Migraines Neg Hx     Social History   Socioeconomic History   Marital status: Single    Spouse name: n/a   Number of children: 0   Years of education: Bachelor's Degree   Highest education level: Not on file  Occupational History   Occupation: Doctor, hospital    Comment: [employer]  Tobacco Use   Smoking status: Never   Smokeless tobacco: Never   Tobacco comments:    Hooka  Vaping Use   Vaping Use: Never used  Substance and Sexual Activity   Alcohol use: Not Currently    Comment: "just socially"   Drug use: No   Sexual activity: Yes    Partners: Male    Birth control/protection: None    Comment: inconsistent condom use  Other Topics Concern   Not on file  Social History Narrative   Undergraduate degree in Psychology from [employer].   Lives alone.   Mother lives nearby.   One sister lives in [place], [place].   Father, brother and youngest sister live in [place].   Right handed   Caffeine: 2 large starbucks frappuchinos per week, occasional green tea     Social Determinants of Corporate investment banker Strain: Not on file  Food Insecurity: No Food Insecurity   Worried About Programme researcher, broadcasting/film/video in the Last Year: Never true   Ran Out of Food in the Last Year: Never true  Transportation Needs: Not on file  Physical Activity: Not on file  Stress: Not on file  Social Connections: Not on file  Intimate Partner Violence: Not on file    Outpatient Medications Prior to Visit  Medication Sig Dispense Refill   azelastine (ASTELIN) 0.1 % nasal spray Place 1 spray into both nostrils 2 (two) times daily. Use in each nostril as directed 30 mL 12   butalbital-acetaminophen-caffeine (FIORICET) 50-325-40 MG tablet Take 1-2 tablets by mouth every 6 (six) hours as needed for headache. 20 tablet 0   fluticasone (FLONASE) 50 MCG/ACT nasal spray Place 2 sprays into both nostrils daily. 16 g 6   hydrocortisone (ANUSOL-HC) 2.5 % rectal cream Place 1 application rectally 2 (two) times daily. 30 g 0   omeprazole (PRILOSEC) 40 MG capsule Take 1 capsule (40 mg total) by mouth daily. 30 capsule 3   No facility-administered medications prior to visit.  No Known Allergies  ROS Review of Systems  Constitutional: Negative.   HENT: Negative.    Eyes: Negative.   Respiratory: Negative.    Cardiovascular: Negative.   Gastrointestinal: Negative.   Genitourinary: Negative.   Musculoskeletal: Negative.   Skin: Negative.   Neurological: Negative.   Psychiatric/Behavioral: Negative.    All other systems reviewed and are negative.    Objective:    Physical Exam Vitals and nursing note reviewed.  Constitutional:      General: [name] not in acute distress.    Appearance: Normal appearance. [name] normal weight. [name] not ill-appearing, toxic-appearing or diaphoretic.  Cardiovascular:     Rate and Rhythm: Normal rate and regular rhythm.     Heart sounds: Normal heart sounds. No murmur heard.   No friction rub. No gallop.  Pulmonary:     Effort:  Pulmonary effort is normal. No respiratory distress.     Breath sounds: Normal breath sounds. No stridor. No wheezing, rhonchi or rales.  Chest:     Chest wall: No tenderness.  Skin:    General: Skin is warm and dry.  Neurological:     General: No focal deficit present.     Mental Status: [name] alert and oriented to person, place, and time. Mental status is at baseline.  Psychiatric:        Mood and Affect: Mood normal.        Behavior: Behavior normal.        Thought Content: Thought content normal.        Judgment: Judgment normal.    BP 104/70    Pulse 70    Temp 98.5 F (36.9 C) (Temporal)    Resp 18    Ht 5' (1.524 m)    Wt 172 lb 6.4 oz (78.2 kg)    SpO2 97%    BMI 33.67 kg/m  Wt Readings from Last 3 Encounters:  11/23/21 208 lb (94.3 kg)  03/31/21 175 lb (79.4 kg)  02/07/21 172 lb (78 kg)     Health Maintenance Due  Topic Date Due   URINE MICROALBUMIN  Never done   PAP SMEAR-Modifier  06/29/2019   COVID-19 Vaccine (3 - Booster for Moderna series) 06/14/2020   INFLUENZA VACCINE  Never done    There are no preventive care reminders to display for this patient.  Lab Results  Component Value Date   TSH 2.520 07/31/2020   Lab Results  Component Value Date   WBC 16.2 (H) 11/25/2021   HGB 10.3 (L) 11/25/2021   HCT 31.8 (L) 11/25/2021   MCV 92.2 11/25/2021   PLT 168 11/25/2021   Lab Results  Component Value Date   NA 136 03/31/2021   K 3.7 03/31/2021   CO2 23 03/31/2021   GLUCOSE 103 (H) 03/31/2021   BUN 14 03/31/2021   CREATININE 0.83 03/31/2021   BILITOT 0.5 03/31/2021   ALKPHOS 56 03/31/2021   AST 21 03/31/2021   ALT 27 03/31/2021   PROT 6.8 03/31/2021   ALBUMIN 3.5 03/31/2021   CALCIUM 9.3 03/31/2021   ANIONGAP 8 03/31/2021   No results found for: CHOL No results found for: HDL No results found for: LDLCALC No results found for: TRIG No results found for: CHOLHDL Lab Results  Component Value Date   HGBA1C 5.7 (H) 06/25/2019       Assessment & Plan:   Problem List Items Addressed This Visit   None Visit Diagnoses     COVID-19    -  Primary       No orders of the defined types were placed in this encounter.   Follow-up: No follow-ups on file.   PLAN Exam unremarkable Return note given Patient encouraged to call clinic with any questions, comments, or concerns.  Janeece Ageeichard Canden Cieslinski, NP

## 2021-12-26 DIAGNOSIS — Z452 Encounter for adjustment and management of vascular access device: Secondary | ICD-10-CM | POA: Diagnosis not present

## 2021-12-26 DIAGNOSIS — B009 Herpesviral infection, unspecified: Secondary | ICD-10-CM | POA: Diagnosis not present

## 2021-12-27 ENCOUNTER — Other Ambulatory Visit (HOSPITAL_COMMUNITY): Payer: Self-pay

## 2021-12-29 DIAGNOSIS — Z309 Encounter for contraceptive management, unspecified: Secondary | ICD-10-CM | POA: Diagnosis not present

## 2021-12-29 DIAGNOSIS — K644 Residual hemorrhoidal skin tags: Secondary | ICD-10-CM | POA: Diagnosis not present

## 2022-05-24 IMAGING — CT CT ABD-PELV W/ CM
4 series · 13 of 46 positions shown, 18 images · IV contrast (omnipaque)
Comparison: None.

CLINICAL DATA: Left lower quadrant pain.  Diverticulitis suspected.

EXAM:
CT ABDOMEN AND PELVIS WITH CONTRAST
TECHNIQUE: Multidetector CT imaging of the abdomen and pelvis was performed
using the standard protocol following bolus administration of
intravenous contrast.
CONTRAST:  100mL OMNIPAQUE IOHEXOL 300 MG/ML  SOLN

[Series 4: abdomen 5.0 · axial · 0.80mm/px · z∈[+1014,+1344]mm · 7 of 90 slices shown, 12 images]
[im 12/90  soft-tissue]
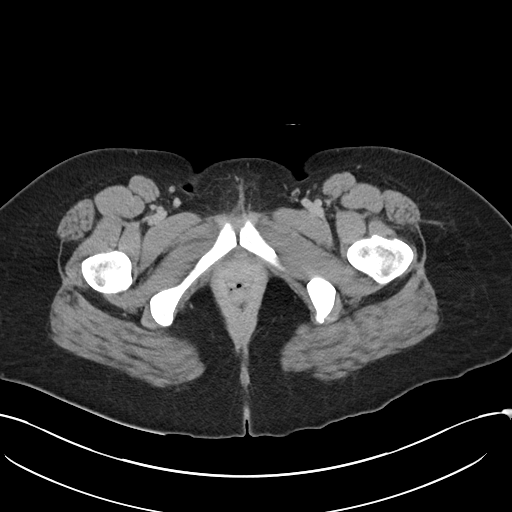
[im 12/90  bone]
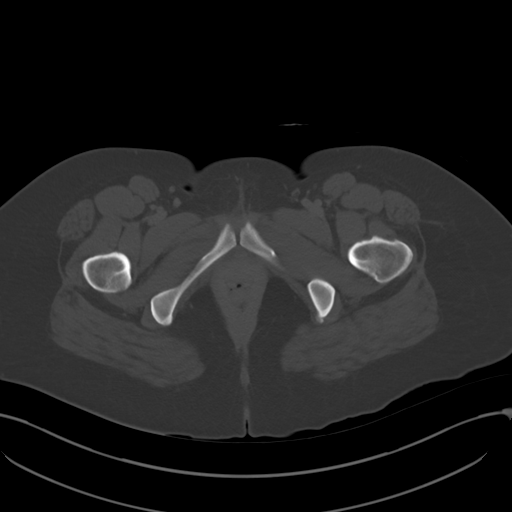
[im 23/90  soft-tissue]
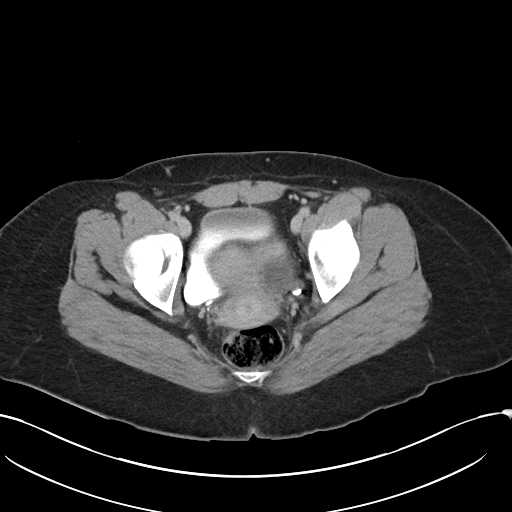
[im 34/90  soft-tissue]
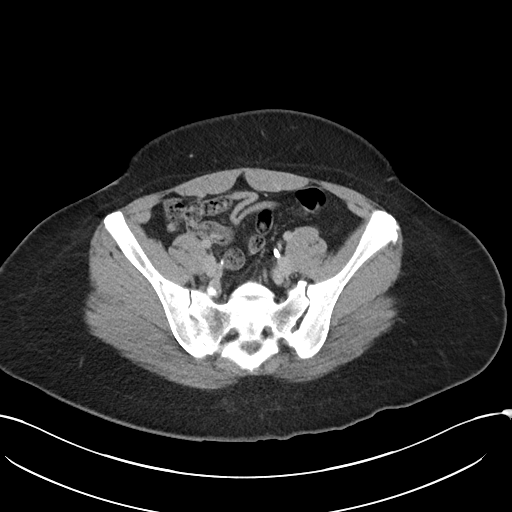
[im 45/90  soft-tissue]
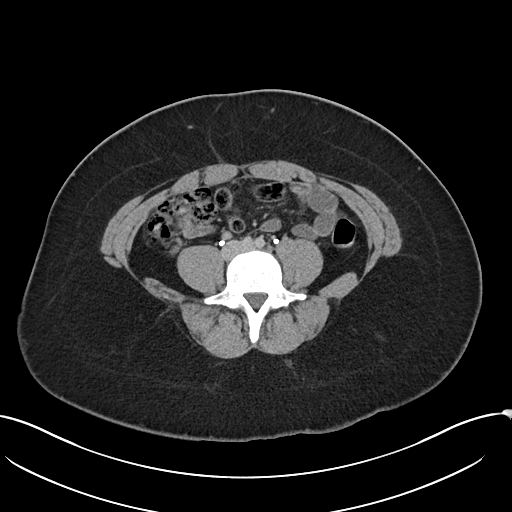
[im 45/90  lung]
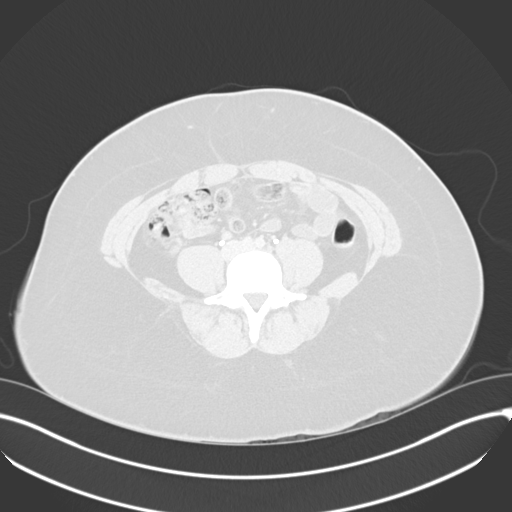
[im 56/90  soft-tissue]
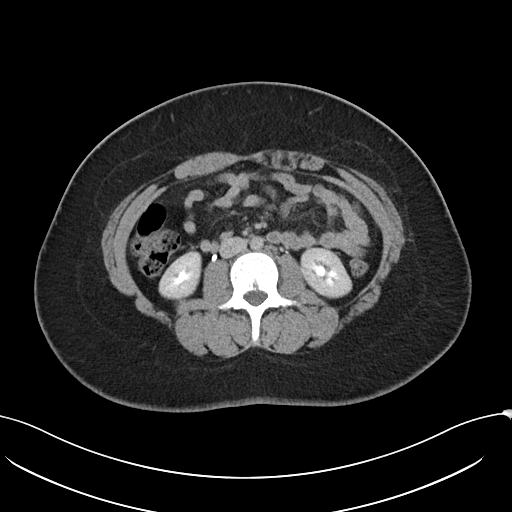
[im 56/90  lung]
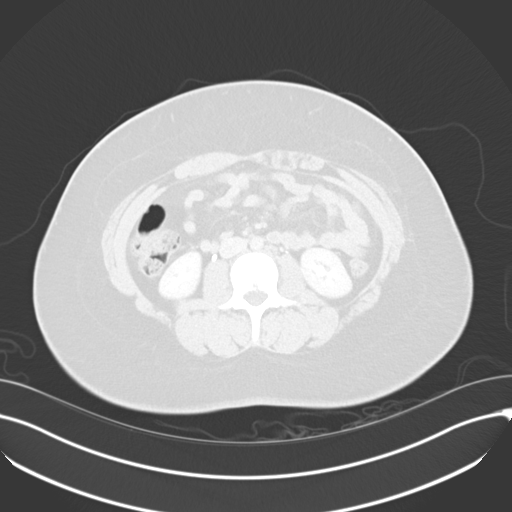
[im 67/90  soft-tissue]
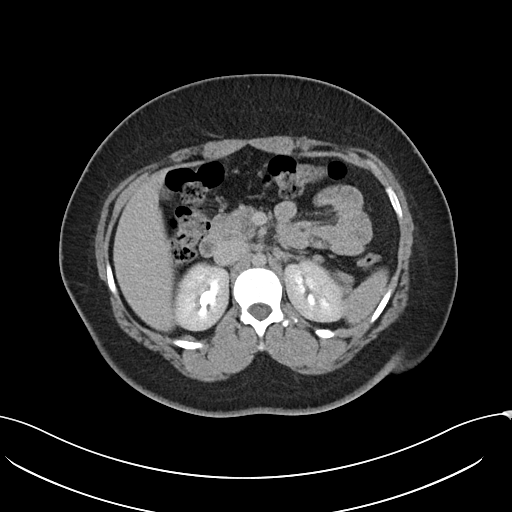
[im 67/90  lung]
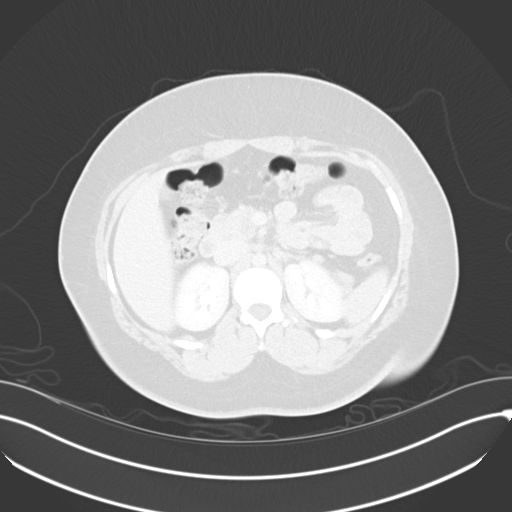
[im 78/90  soft-tissue]
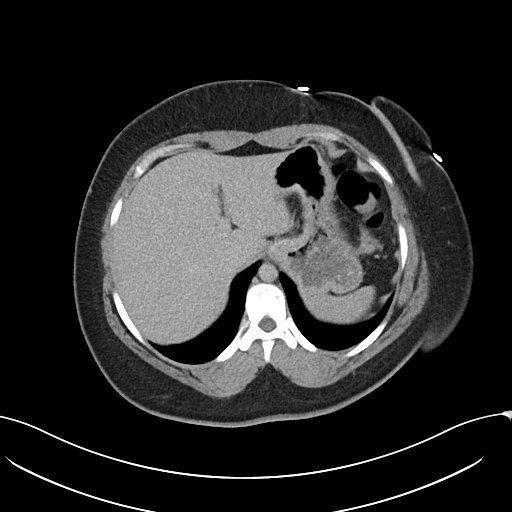
[im 78/90  lung]
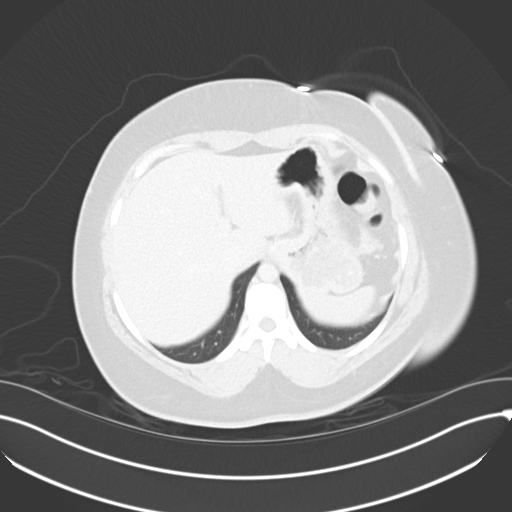

[Series 6: lung · axial · 0.80mm/px · z∈[+999,+1039]mm · 2 of 224 slices shown]
[im 21/224  bone]
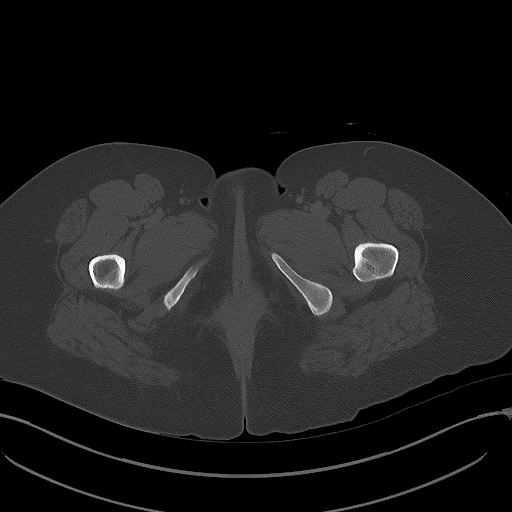
[im 41/224  bone]
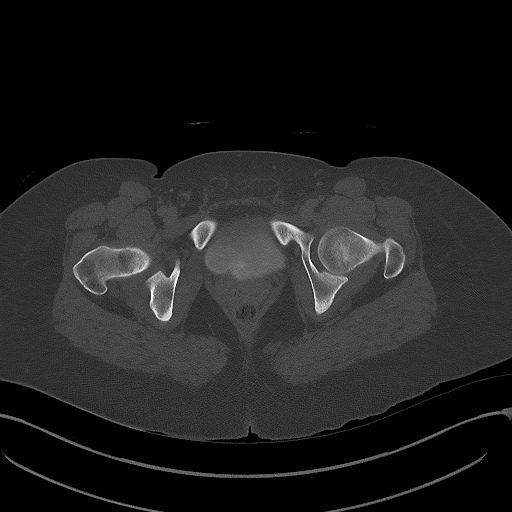

[Series 7: abdomen 3.0 mpr cor · coronal · 0.91mm/px · 3 of 99 slices shown]
[im 33/99  soft-tissue]
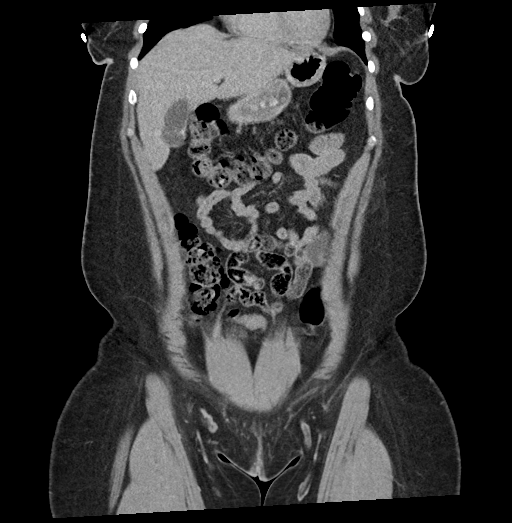
[im 44/99  soft-tissue]
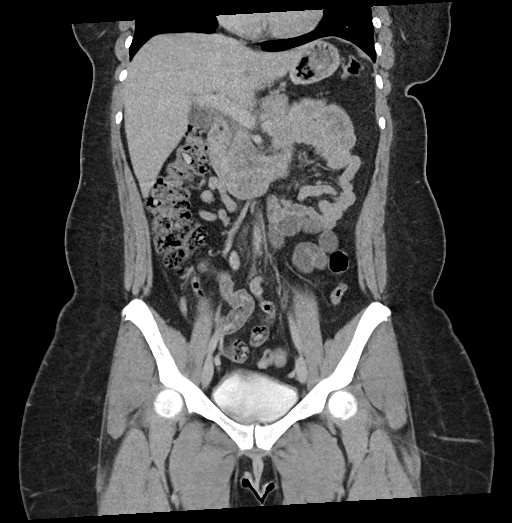
[im 55/99  soft-tissue]
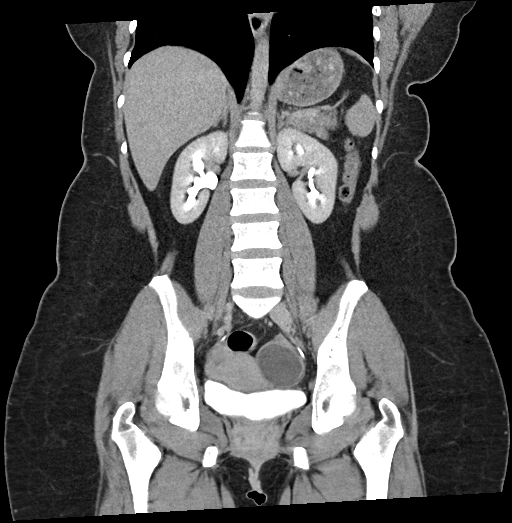

[Series 8: abdomen 3.0 mpr sag · sagittal · 0.58mm/px · 1 of 143 slices shown]
[im 48/143  soft-tissue]
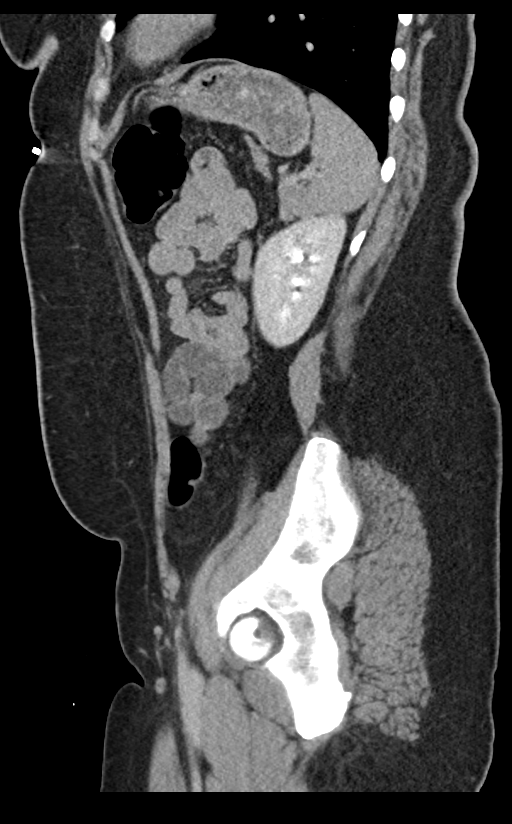

[13 of 46 positions shown; findings below may reference images not displayed]

FINDINGS: Lower chest: No acute abnormality.

Hepatobiliary: No focal liver abnormality. No gallstones,
gallbladder wall thickening, or pericholecystic fluid. No biliary
dilatation.

Pancreas: No focal lesion. Normal pancreatic contour. No surrounding
inflammatory changes. No main pancreatic ductal dilatation.

Spleen: Normal in size without focal abnormality.

Adrenals/Urinary Tract: No adrenal nodule bilaterally. Bilateral
kidneys excrete symmetrically. No hydronephrosis. No hydroureter.
The urinary bladder is unremarkable. There is no urothelial wall
thickening and there are no filling defects in the opacified
portions of the bilateral collecting systems or ureters.

Stomach/Bowel: Stomach is within normal limits. No evidence of bowel
wall thickening or dilatation. Appendix appears normal.

Vascular/Lymphatic: No significant vascular findings are present. No
enlarged abdominal or pelvic lymph nodes.

Reproductive: A simple appearing 3.8 cm left ovarian cystic lesion
is noted. Otherwise the uterus and bilateral adnexa are
unremarkable.

Other: Trace free fluid in the right lower quadrant. No
intraperitoneal free gas. No organized fluid collection.

Musculoskeletal: No acute or significant osseous findings.
IMPRESSION: 1. A simple appearing 3.8 cm left ovarian cystic lesion. No
follow-up imaging recommended. Note: This recommendation does not
apply to premenarchal patients and to those with increased risk
(genetic, family history, elevated tumor markers or other high-risk
factors) of ovarian cancer. Reference: JACR [DATE]):[id number].
2. Otherwise no acute intra-abdominal or intrapelvic abnormality.

## 2022-11-28 DIAGNOSIS — E1165 Type 2 diabetes mellitus with hyperglycemia: Secondary | ICD-10-CM | POA: Insufficient documentation

## 2023-03-08 DIAGNOSIS — T7849XA Other allergy, initial encounter: Secondary | ICD-10-CM | POA: Diagnosis not present

## 2023-03-08 DIAGNOSIS — Z124 Encounter for screening for malignant neoplasm of cervix: Secondary | ICD-10-CM | POA: Diagnosis not present

## 2023-03-08 DIAGNOSIS — Z760 Encounter for issue of repeat prescription: Secondary | ICD-10-CM | POA: Diagnosis not present

## 2023-03-08 DIAGNOSIS — Z01419 Encounter for gynecological examination (general) (routine) without abnormal findings: Secondary | ICD-10-CM | POA: Diagnosis not present

## 2023-03-08 LAB — RESULTS CONSOLE HPV: CHL HPV: NEGATIVE

## 2023-03-08 LAB — HM PAP SMEAR: HM Pap smear: NEGATIVE

## 2023-03-28 DIAGNOSIS — R21 Rash and other nonspecific skin eruption: Secondary | ICD-10-CM | POA: Diagnosis not present

## 2023-04-18 ENCOUNTER — Ambulatory Visit: Payer: Federal, State, Local not specified - PPO | Admitting: Internal Medicine

## 2023-04-18 ENCOUNTER — Other Ambulatory Visit: Payer: Self-pay

## 2023-04-18 ENCOUNTER — Encounter: Payer: Self-pay | Admitting: Internal Medicine

## 2023-04-18 VITALS — BP 110/72 | HR 94 | Temp 98.6°F | Ht 60.63 in | Wt 193.4 lb

## 2023-04-18 DIAGNOSIS — R21 Rash and other nonspecific skin eruption: Secondary | ICD-10-CM | POA: Diagnosis not present

## 2023-04-18 DIAGNOSIS — J301 Allergic rhinitis due to pollen: Secondary | ICD-10-CM

## 2023-04-18 DIAGNOSIS — L299 Pruritus, unspecified: Secondary | ICD-10-CM | POA: Diagnosis not present

## 2023-04-18 DIAGNOSIS — L272 Dermatitis due to ingested food: Secondary | ICD-10-CM | POA: Diagnosis not present

## 2023-04-18 MED ORDER — CETIRIZINE HCL 10 MG PO TABS: 10.0000 mg | ORAL_TABLET | Freq: Every day | ORAL | 5 refills | Status: DC

## 2023-04-18 NOTE — Patient Instructions (Addendum)
Rashes - Negative food testing.   - Do a daily soaking tub bath in warm water for 10-15 minutes.  - Use a gentle, unscented cleanser at the end of the bath (such as Dove unscented bar or baby wash, or Aveeno sensitive body wash). Then rinse, pat half-way dry, and apply a gentle, unscented moisturizer cream or ointment (Cerave, Cetaphil, Eucerin, Aveeno)  all over while still damp.  The skin should be moisturized with a gentle, unscented moisturizer at least twice daily.  - Use only unscented liquid laundry detergent. - Consider patch testing.  Set up when you get a chance.  - If the rash reoccurs, start Zyrtec 10mg  daily.  Please also take pictures and keep track of any triggers and how often it is occurring and what you took for it.    Allergic Rhinitis: - Positive skin test to: trees, weeds, grasses  - Avoidance measures discussed. - Use nasal saline spray as needed.   - Use Zyrtec 10 mg daily as needed.    Pollen Avoidance Pollen levels are highest during the mid-day and afternoon.  Consider this when planning outdoor activities. Avoid being outside when the grass is being mowed, or wear a mask if the pollen-allergic person must be the one to mow the grass. Keep the windows closed to keep pollen outside of the home. Use an air conditioner to filter the air. Take a shower, wash hair, and change clothing after working or playing outdoors during pollen season.

## 2023-04-18 NOTE — Progress Notes (Signed)
NEW PATIENT  Date of Service/Encounter:  04/18/23  Consult requested by: Jac Canavan, PA-C   Subjective:   Tina Thornton (DOB: 07/23/1991) is a 32 y.o. female who presents to the clinic on 04/18/2023 with a chief complaint of Urticaria and Allergies .    History obtained from: chart review and patient.   Rashes: Reports onset around January 2024.  She has had 4 episodes so far. [name] wondering if it is food related but can't pinpoint a common trigger.  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  Next day, she woke up with vomiting and broke out in a rash all over her face.  Rash is itchy and bumpy. Not hives or welts.  Rash took a couple of weeks to resolve.  Most recently, occurred last month where she ate potato salad in the afternoon and next day, had multiple episodes of vomiting and again broke out in same rash.  Went to urgent care and was given oral steroids and it started resolving it about 2 days.  Also took benadryl which helps.   She now eats chicken, dairy, eggs without any issues. Never has had any trouble with food allergies. Did report starting Women's Centrum Vitamin around the same time and has stopped it. No new products. No poison ivy exposure.   Rhinitis: Does report intermittent stuffy nose and runny nose but it is fine as long as she avoids going outdoors. Mostly during Spring/Summer. Not using anything for this regularly. Does take PRN benadryl. She had to use nose sprays a couple of years ago but thinks that she was sick at the time.  No clear triggers.   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 If you have any questions, ask your nurse or doctor.          ferrous sulfate 325 (65 FE) MG tablet Take 325 mg by mouth daily.   ibuprofen 600 MG tablet Commonly known as: ADVIL Take 1 tablet (600 mg total) by mouth every 6 (six) hours.   prenatal multivitamin Tabs tablet Take 1 tablet by mouth daily at 12 noon.         REVIEW OF SYSTEMS: Pertinent positives and negatives discussed in HPI.   Objective:   Physical Exam: BP 110/72   Pulse 94   Temp 98.6 F (37 C) (Temporal)   Ht 5' 0.63" (1.54 m)   Wt 193 lb 6.4 oz (87.7 kg)   SpO2 98%  BMI 36.99 kg/m  Body mass index is 36.99 kg/m. GEN: alert, well developed HEENT: clear conjunctiva, TM grey and translucent, nose with mild inferior turbinate hypertrophy, pink nasal mucosa, no rhinorrhea, no cobblestoning HEART: regular rate and rhythm, no murmur LUNGS: clear to auscultation bilaterally, no coughing, unlabored respiration ABDOMEN: soft, non distended  SKIN: acneiform lesions on cheeks and chin   Reviewed:  03/28/2023: seen in urgent care for vomiting and rash.  Ate potato salad the day before.  Rash was not urticarial on exam.  No suspicion for anaphylaxis.  Started on prednisone.   10/26/2021: seen by obgyn for pregnancy.  Had gestational DM but other than that, no significant PMH or PSH.   10/30/2020: saw Kateri Plummer NP for nasal congestion and headaches for over 2 weeks. Started on Flonase and Azelastine. Thought to be viral or allergic cause.   Skin Testing:   Skin prick testing was placed, which includes aeroallergens/foods, histamine control, and saline control.  Verbal consent was obtained prior to placing test.  Patient tolerated procedure well.  Allergy testing results were read and interpreted by myself, documented by clinical staff. Adequate positive and negative control.  Results discussed with patient/family.  Airborne Adult Perc - 04/18/23 0935     Time Antigen Placed 1610    Allergen Manufacturer Waynette Buttery    Location Back    Number of Test 59    1. Control-Buffer 50% Glycerol Negative    2. Control-Histamine 1 mg/ml 3+    3. Albumin saline Negative    4. Bahia Negative    5. French Southern [place] Negative    6. Johnson Negative    7. Kentucky Blue 3+    8. Meadow Fescue Negative    9. Perennial Rye 3+    10. Sweet Vernal Negative    11. Timothy 2+    12. Cocklebur Negative    13. Burweed Marshelder Negative    14. Ragweed, short Negative    15. Ragweed, Giant Negative    16. Plantain,  English Negative    17. Lamb's Quarters Negative    18. Sheep Sorrell Negative    19. Rough Pigweed Negative    20. Marsh Elder, Rough Negative    21. Mugwort, Common 3+    22. Ash mix 3+    23. Birch mix 3+    24. Beech American Negative    25. Box, Elder Negative    26. Cedar, red Negative    27. Cottonwood, Eastern 3+    28. Elm mix 3+    29. Hickory Negative    30. Maple mix Negative    31. Oak, [place] mix 3+    32. Pecan Pollen 3+    33. Pine mix Negative    34. Sycamore Eastern 3+    35. Walnut, Black Pollen 3+    36. Alternaria alternata Negative    37. Cladosporium Herbarum Negative    38. Aspergillus mix Negative    39. Penicillium mix Negative    40. Bipolaris sorokiniana (Helminthosporium) Negative    41. Drechslera spicifera (Curvularia) Negative    42. Mucor plumbeus Negative    43. Fusarium moniliforme Negative    44. Aureobasidium pullulans (pullulara) Negative    45. Rhizopus oryzae Negative    46. Botrytis cinera Negative     47. Epicoccum nigrum Negative    48. Phoma betae Negative    49. Candida Albicans Negative    50. Trichophyton mentagrophytes Negative    51. Mite, D Farinae  5,000 AU/ml  Negative    52. Mite, D Pteronyssinus  5,000 AU/ml Negative    53. Cat Hair 10,000 BAU/ml Negative    54.  Dog Epithelia Negative    55. Mixed Feathers Negative    56. Horse Epithelia Negative    57. Cockroach, German Negative    58. Mouse Negative    59. Tobacco Leaf Negative             Food Perc - 04/18/23 0937       Test Information   Time Antigen Placed 0960    Allergen Manufacturer Waynette Buttery    Location Back    Number of allergen test 10      Food   1. Peanut Negative    2. Soybean food Negative    3. Wheat, whole Negative    4. Sesame Negative    5. Milk, cow Negative    6. Egg White, chicken Negative    7. Casein Negative    8. Shellfish mix Negative    9. Fish mix Negative    10. Cashew Negative               Assessment:   1. Pruritus   2. Dermatitis due to ingested food   3. Seasonal allergic rhinitis due to pollen   4. Rash and nonspecific skin eruption     Plan/Recommendations:  Rashes - Unclear etiology.  Not hives/eczema. Negative food testing to commonly allergenic foods and this has high negative predictive value so low suspicion for food allergy.  I do wonder if this some form of contact dermatitis as the rash is lasting for weeks.  She has also stopped the vitamin supplements (only new trigger).     - Do a daily soaking tub bath in warm water for 10-15 minutes.  - Use a gentle, unscented cleanser at the end of the bath (such as Dove unscented bar or baby wash, or Aveeno sensitive body wash). Then rinse, pat half-way dry, and apply a gentle, unscented moisturizer cream or ointment (Cerave, Cetaphil, Eucerin, Aveeno)  all over while still damp.  The skin should be moisturized with a gentle, unscented moisturizer at least twice daily.  - Use only unscented liquid laundry  detergent. - Consider patch testing if rash recurs.  Set up when you get a chance.  - If the rash reoccurs, start Zyrtec 10mg  daily. Reported some relief with benadryl.    Allergic Rhinitis: - Due to turbinate hypertrophy and seasonal symptoms, performed skin testing to identify aeroallergen triggers.   - Positive skin test to: trees, weeds, grasses  - Avoidance measures discussed. - Use nasal saline spray as needed.   - Use Zyrtec 10 mg daily as needed.     Return in about 3 months (around 07/19/2023).  Alesia Morin, MD [facility]

## 2023-04-19 ENCOUNTER — Encounter: Payer: Self-pay | Admitting: Medical

## 2023-04-19 ENCOUNTER — Ambulatory Visit: Payer: Federal, State, Local not specified - PPO | Admitting: Medical

## 2023-04-19 VITALS — BP 120/80 | HR 86 | Temp 97.6°F | Ht 60.5 in | Wt 192.2 lb

## 2023-04-19 DIAGNOSIS — Z8616 Personal history of COVID-19: Secondary | ICD-10-CM | POA: Diagnosis not present

## 2023-04-19 DIAGNOSIS — Z8632 Personal history of gestational diabetes: Secondary | ICD-10-CM | POA: Diagnosis not present

## 2023-04-19 DIAGNOSIS — Z0000 Encounter for general adult medical examination without abnormal findings: Secondary | ICD-10-CM | POA: Diagnosis not present

## 2023-04-19 DIAGNOSIS — Z131 Encounter for screening for diabetes mellitus: Secondary | ICD-10-CM

## 2023-04-19 DIAGNOSIS — R7989 Other specified abnormal findings of blood chemistry: Secondary | ICD-10-CM | POA: Diagnosis not present

## 2023-04-19 MED ORDER — BENZONATATE 100 MG PO CAPS: 100.0000 mg | ORAL_CAPSULE | Freq: Two times a day (BID) | ORAL | 0 refills | Status: DC | PRN

## 2023-04-19 NOTE — Patient Instructions (Addendum)
Cough Begin tessalon perle prescription cough drops as needed Begin over the counter Zyrtec 10mg  daily at bedtime allergy pill for the next weeks If the cough doesn't go away within the next 2 weeks, then we should consider a chest xray  Return fasting for labs within the next week    This visit was a preventative care visit, also known as wellness visit or routine physical.   Topics typically include healthy lifestyle, diet, exercise, preventative care, vaccinations, sick and well care, proper use of emergency dept and after hours care, as well as other concerns.    Separate significant issues discussed: Cough Begin tessalon perle prescription cough drops as needed Begin over the counter Zyrtec 10mg  daily at bedtime allergy pill for the next weeks If the cough doesn't go away within the next 2 weeks, then we should consider a chest xray  Return fasting for labs within the next week   General Recommendations: Continue to return yearly for your annual wellness and preventative care visits.  This gives [place] a chance to discuss healthy lifestyle, exercise, vaccinations, review your chart record, and perform screenings where appropriate.  I recommend you see your eye doctor yearly for routine vision care.  I recommend you see your dentist yearly for routine dental care including hygiene visits twice yearly.   Vaccination recommendations were reviewed Immunization History  Administered Date(s) Administered   [employer] Vaccination 03/22/2020, 04/19/2020   Tdap 05/08/2020    Vaccine recommendations: Consider yearly flu shot   Screening for cancer: Colon cancer screening: Age 8  Breast cancer screening: You should perform a self breast exam monthly.   We reviewed recommendations for regular mammograms and breast cancer screening.  Cervical cancer screening: We reviewed recommendations for pap smear screening. Last pap - will requet recent pap from gyn   Skin  cancer screening: Check your skin regularly for new changes, growing lesions, or other lesions of concern Come in for evaluation if you have skin lesions of concern.  Lung cancer screening: If you have a greater than 20 pack year history of tobacco use, then you may qualify for lung cancer screening with a chest CT scan.   Please call your insurance company to inquire about coverage for this test.  Pancreatic cancer: no current screening test is available routinely recommended.  (Risk factors: Smoking, overweight or obese, diabetes, chronic pancreatitis, work Nurse, mental health, Solicitor, 43 year old or greater, female greater than female, African-American, family history of pancreatic cancer, hereditary breast, ovarian, melanoma, Lynch, Peutz-jeghers).  We currently don't have screenings for other cancers besides breast, cervical, colon, and lung cancers.  If you have a strong family history of cancer or have other cancer screening concerns, please let me know.    Bone health: Get at least 150 minutes of aerobic exercise weekly Get weight bearing exercise at least once weekly Bone density test:  A bone density test is an imaging test that uses a type of X-ray to measure the amount of calcium and other minerals in your bones. The test may be used to diagnose or screen you for a condition that causes weak or thin bones (osteoporosis), predict your risk for a broken bone (fracture), or determine how well your osteoporosis treatment is working. The bone density test is recommended for females 65 and older, or females or males <65 if certain risk factors such as thyroid disease, long term use of steroids such as for asthma or rheumatological issues, vitamin D deficiency, estrogen deficiency, family history of osteoporosis, self or  family history of fragility fracture in first degree relative.    Heart health: Get at least 150 minutes of aerobic exercise weekly Limit alcohol It is important  to maintain a healthy blood pressure and healthy cholesterol numbers  Heart disease screening: Screening for heart disease includes screening for blood pressure, fasting lipids, glucose/diabetes screening, BMI height to weight ratio, reviewed of smoking status, physical activity, and diet.    Goals include blood pressure 120/80 or less, maintaining a healthy lipid/cholesterol profile, preventing diabetes or keeping diabetes numbers under good control, not smoking or using tobacco products, exercising most days per week or at least 150 minutes per week of exercise, and eating healthy variety of fruits and vegetables, healthy oils, and avoiding unhealthy food choices like fried food, fast food, high sugar and high cholesterol foods.    Other tests may possibly include EKG test, CT coronary calcium score, echocardiogram, exercise treadmill stress test.    Vascular disease screening: For high risk individuals including smokers, diabetes, patients with known heart disease or high blood pressure, kidney disease, and others, screening for vascular disease or atherosclerosis of the arteries is available.  Examples may include carotid ultrasound, abdominal aortic ultrasound, ABI blood flow screening in the legs, thoracic aorta screening.   Medical care options: I recommend you continue to seek care here first for routine care.  We try really hard to have available appointments Monday through Friday daytime hours for sick visits, acute visits, and physicals.  Urgent care should be used for after hours and weekends for significant issues that cannot wait till the next day.  The emergency department should be used for significant potentially life-threatening emergencies.  The emergency department is expensive, can often have long wait times for less significant concerns, so try to utilize primary care, urgent care, or telemedicine when possible to avoid unnecessary trips to the emergency department.  Virtual  visits and telemedicine have been introduced since the pandemic started in 2020, and can be convenient ways to receive medical care.  We offer virtual appointments as well to assist you in a variety of options to seek medical care.   Legal Take the time to do a Last Will and Testament, advanced directives including Healthcare Power of Attorney and Living Will documents.  Do not leave your family with burdens that can be handled ahead of time.   Advanced Directives: I recommend you consider completing a Health Care Power of Attorney and Living Will.   These documents respect your wishes and help alleviate burdens on your loved ones if you were to become terminally ill or be in a position to need those documents enforced.    You can complete Advanced Directives yourself, have them notarized, then have copies made for our office, for you and for anybody you feel should have them in safe keeping.  Or, you can have an attorney prepare these documents.   If you haven't updated your Last Will and Testament in a while, it may be worthwhile having an attorney prepare these documents together and save on some costs.       Spiritual and Emotional Health Keeping a healthy spiritual life can help you better manage your physical health. Your spiritual life can help you to cope with any issues that may arise with your physical health.  Balance can keep [place] healthy and help [place] to recover.  If you are struggling with your spiritual health there are questions that you may want to ask yourself:  What makes me  feel most complete? When do I feel most connected to the rest of the world? Where do I find the most inner strength? What am I doing when I feel whole?  Helpful tips: Being in nature. Some people feel very connected and at peace when they are walking outdoors or are outside. Helping others. Some feel the largest sense of wellbeing when they are of service to others. Being of service can take on many  forms. It can be doing volunteer work, being kind to strangers, or offering a hand to a friend in need. Gratitude. Some people find they feel the most connected when they remain grateful. They may make lists of all the things they are grateful for or say a thank you out loud for all they have.    Emotional Health Are you in tune with your emotional health?  Check out this link: http://www.marquez-love.com/   Financial Health Make sure you use a budget for your personal finances Make sure you are insured against risks (health insurance, life insurance, auto insurance, etc) Save more, spend less Set financial goals If you need help in this area, good resources include counseling through [employer] or other community resources, have a meeting with a Social research officer, government, and a good resource is [employer]

## 2023-04-19 NOTE — Progress Notes (Signed)
Subjective:   HPI  Tina Thornton is a 32 y.o. female who presents for Chief Complaint  Patient presents with   Annual Exam    New pt, cough x a couple week- cough out mucous    Patient Care Team: Shellene Sweigert, Kermit Balo, PA-C as PCP - General (Family Medicine) Ob/Gyn, [facility] (Obstetrics and Gynecology), [facility] [address]. Alesia Morin, allergist Sees eye doctor  Sees dentist   Concerns: New patient today to establish care.   Was seeing [facility] before they closed down.  Having cough for a few weeks.  No current allergy symptoms.   No GERD or belching.  No belly pain.  No wheezing or sob.  Using nothing currently for the cough.      Gynecological history:  1 pregnancies, 1 live births, patient sees gynecology for gynecology care.   Reviewed their medical, surgical, family, social, medication, and allergy history and updated chart as appropriate.  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   No current facility-administered medications on file prior to visit.      Current Outpatient Medications:    benzonatate (TESSALON) 100 MG capsule, Take 1 capsule (100 mg total) by mouth 2 (two) times daily as needed for cough., Disp: 20 capsule, Rfl: 0  Family History  Problem Relation Age of Onset   Hypertension Mother    Other Sister        lupus   Arthritis Paternal Grandmother    Dementia Paternal Grandfather    Headache Neg Hx    Migraines Neg Hx    Heart disease Neg Hx    Stroke Neg Hx     Past Surgical History:  Procedure Laterality Date   DENTAL SURGERY       Review of Systems  Constitutional:  Negative for chills, fever, malaise/fatigue and weight loss.  HENT:  Negative for  congestion, ear pain, hearing loss, sore throat and tinnitus.   Eyes:  Negative for blurred vision, pain and redness.  Respiratory:  Negative for cough, hemoptysis and shortness of breath.   Cardiovascular:  Negative for chest pain, palpitations, orthopnea, claudication and leg swelling.  Gastrointestinal:  Negative for abdominal pain, blood in stool, constipation, diarrhea, nausea and vomiting.  Genitourinary:  Negative for dysuria, flank pain, frequency, hematuria and urgency.  Musculoskeletal:  Negative for falls, joint pain and myalgias.  Skin:  Negative for itching and rash.  Neurological:  Negative for dizziness, tingling, speech change, weakness and headaches.  Endo/Heme/Allergies:  Negative for polydipsia. Does not bruise/bleed easily.  Psychiatric/Behavioral:  Negative for depression and memory loss. The patient is not nervous/anxious and does not have insomnia.        Objective:  BP 120/80   Pulse 86   Temp 97.6 F (36.4 C)   Ht 5' 0.5" (1.537 m)   Wt 192 lb 3.2 oz (87.2 kg)   BMI 36.92 kg/m   General appearance: alert, no distress, WD/WN Skin: unremarkable HEENT: normocephalic, conjunctiva/corneas normal, sclerae anicteric, PERRLA, EOMi, nares patent, no discharge or erythema, pharynx normal Oral cavity: MMM, tongue normal, teeth normal Neck: supple, no lymphadenopathy, no thyromegaly, no  masses, normal ROM, no bruits Chest: non tender, normal shape and expansion Heart: RRR, normal S1, S2, no murmurs Lungs: CTA bilaterally, no wheezes, rhonchi, or rales Abdomen: +bs, soft, non tender, non distended, no masses, no hepatomegaly, no splenomegaly, no bruits Back: non tender, normal ROM, no scoliosis Musculoskeletal: upper extremities non tender, no obvious deformity, normal ROM throughout, lower extremities non tender, no obvious deformity, normal ROM throughout Extremities: no edema, no cyanosis, no clubbing Pulses: 2+ symmetric, upper and lower extremities, normal cap  refill Neurological: alert, oriented x 3, CN2-12 intact, strength normal upper extremities and lower extremities, sensation normal throughout, DTRs 2+ throughout, no cerebellar signs, gait normal Psychiatric: normal affect, behavior normal, pleasant  Breast/gyn/rectal - deferred to gynecology     Assessment and Plan :   Encounter Diagnoses  Name Primary?   Routine general medical examination at a health care facility Yes   History of gestational diabetes    LFT elevation    History of COVID-19    Screening for diabetes mellitus      This visit was a preventative care visit, also known as wellness visit or routine physical.   Topics typically include healthy lifestyle, diet, exercise, preventative care, vaccinations, sick and well care, proper use of emergency dept and after hours care, as well as other concerns.    Separate significant issues discussed: Cough Begin tessalon perle prescription cough drops as needed Begin over the counter Zyrtec 10mg  daily at bedtime allergy pill for the next weeks If the cough doesn't go away within the next 2 weeks, then we should consider a chest xray  Return fasting for labs within the next week   General Recommendations: Continue to return yearly for your annual wellness and preventative care visits.  This gives [place] a chance to discuss healthy lifestyle, exercise, vaccinations, review your chart record, and perform screenings where appropriate.  I recommend you see your eye doctor yearly for routine vision care.  I recommend you see your dentist yearly for routine dental care including hygiene visits twice yearly.   Vaccination recommendations were reviewed Immunization History  Administered Date(s) Administered   [employer] Vaccination 03/22/2020, 04/19/2020   Tdap 05/08/2020    Vaccine recommendations: Consider yearly flu shot   Screening for cancer: Colon cancer screening: Age 65  Breast cancer screening: You  should perform a self breast exam monthly.   We reviewed recommendations for regular mammograms and breast cancer screening.  Cervical cancer screening: We reviewed recommendations for pap smear screening. Last pap - will request recent pap from gyn   Skin cancer screening: Check your skin regularly for new changes, growing lesions, or other lesions of concern Come in for evaluation if you have skin lesions of concern.  Lung cancer screening: If you have a greater than 20 pack year history of tobacco use, then you may qualify for lung cancer screening with a chest CT scan.   Please call your insurance company to inquire about coverage for this test.  Pancreatic cancer: no current screening test is available routinely recommended.  (Risk factors: Smoking, overweight or obese, diabetes, chronic pancreatitis, work Nurse, mental health, Solicitor, 6 year old or greater, female greater than female, African-American, family history of pancreatic cancer, hereditary breast, ovarian, melanoma, Lynch, Peutz-jeghers).  We currently don't have screenings for other cancers besides breast, cervical, colon, and lung cancers.  If you have a strong family history of cancer or have other cancer screening concerns, please let me know.    Bone health: Get at least 150  minutes of aerobic exercise weekly Get weight bearing exercise at least once weekly Bone density test:  A bone density test is an imaging test that uses a type of X-ray to measure the amount of calcium and other minerals in your bones. The test may be used to diagnose or screen you for a condition that causes weak or thin bones (osteoporosis), predict your risk for a broken bone (fracture), or determine how well your osteoporosis treatment is working. The bone density test is recommended for females 65 and older, or females or males <65 if certain risk factors such as thyroid disease, long term use of steroids such as for asthma or  rheumatological issues, vitamin D deficiency, estrogen deficiency, family history of osteoporosis, self or family history of fragility fracture in first degree relative.    Heart health: Get at least 150 minutes of aerobic exercise weekly Limit alcohol It is important to maintain a healthy blood pressure and healthy cholesterol numbers  Heart disease screening: Screening for heart disease includes screening for blood pressure, fasting lipids, glucose/diabetes screening, BMI height to weight ratio, reviewed of smoking status, physical activity, and diet.    Goals include blood pressure 120/80 or less, maintaining a healthy lipid/cholesterol profile, preventing diabetes or keeping diabetes numbers under good control, not smoking or using tobacco products, exercising most days per week or at least 150 minutes per week of exercise, and eating healthy variety of fruits and vegetables, healthy oils, and avoiding unhealthy food choices like fried food, fast food, high sugar and high cholesterol foods.    Other tests may possibly include EKG test, CT coronary calcium score, echocardiogram, exercise treadmill stress test.    Vascular disease screening: For high risk individuals including smokers, diabetes, patients with known heart disease or high blood pressure, kidney disease, and others, screening for vascular disease or atherosclerosis of the arteries is available.  Examples may include carotid ultrasound, abdominal aortic ultrasound, ABI blood flow screening in the legs, thoracic aorta screening.   Medical care options: I recommend you continue to seek care here first for routine care.  We try really hard to have available appointments Monday through Friday daytime hours for sick visits, acute visits, and physicals.  Urgent care should be used for after hours and weekends for significant issues that cannot wait till the next day.  The emergency department should be used for significant potentially  life-threatening emergencies.  The emergency department is expensive, can often have long wait times for less significant concerns, so try to utilize primary care, urgent care, or telemedicine when possible to avoid unnecessary trips to the emergency department.  Virtual visits and telemedicine have been introduced since the pandemic started in 2020, and can be convenient ways to receive medical care.  We offer virtual appointments as well to assist you in a variety of options to seek medical care.   Legal Take the time to do a Last Will and Testament, advanced directives including Healthcare Power of Attorney and Living Will documents.  Do not leave your family with burdens that can be handled ahead of time.   Advanced Directives: I recommend you consider completing a Health Care Power of Attorney and Living Will.   These documents respect your wishes and help alleviate burdens on your loved ones if you were to become terminally ill or be in a position to need those documents enforced.    You can complete Advanced Directives yourself, have them notarized, then have copies made for our office, for  you and for anybody you feel should have them in safe keeping.  Or, you can have an attorney prepare these documents.   If you haven't updated your Last Will and Testament in a while, it may be worthwhile having an attorney prepare these documents together and save on some costs.       Spiritual and Emotional Health Keeping a healthy spiritual life can help you better manage your physical health. Your spiritual life can help you to cope with any issues that may arise with your physical health.  Balance can keep [place] healthy and help [place] to recover.  If you are struggling with your spiritual health there are questions that you may want to ask yourself:  What makes me feel most complete? When do I feel most connected to the rest of the world? Where do I find the most inner strength? What am I doing when  I feel whole?  Helpful tips: Being in nature. Some people feel very connected and at peace when they are walking outdoors or are outside. Helping others. Some feel the largest sense of wellbeing when they are of service to others. Being of service can take on many forms. It can be doing volunteer work, being kind to strangers, or offering a hand to a friend in need. Gratitude. Some people find they feel the most connected when they remain grateful. They may make lists of all the things they are grateful for or say a thank you out loud for all they have.    Emotional Health Are you in tune with your emotional health?  Check out this link: http://[email or web address]-love.com/   Financial Health Make sure you use a budget for your personal finances Make sure you are insured against risks (health insurance, life insurance, auto insurance, etc) Save more, spend less Set financial goals If you need help in this area, good resources include counseling through [employer] or other community resources, have a meeting with a Social research officer, government, and a good resource is Salley Slaughter podcast    Sheana was seen today for annual exam.  Diagnoses and all orders for this visit:  Routine general medical examination at a health care facility -     CMP14+EGFR; Future -     CBC with Differential/Platelet; Future -     Lipid panel; Future -     TSH; Future -     Hemoglobin A1c; Future  History of gestational diabetes  LFT elevation  History of COVID-19  Screening for diabetes mellitus -     Hemoglobin A1c; Future  Other orders -     benzonatate (TESSALON) 100 MG capsule; Take 1 capsule (100 mg total) by mouth 2 (two) times daily as needed for cough.    Follow-up pending labs, yearly for physical

## 2023-04-26 ENCOUNTER — Other Ambulatory Visit: Payer: Federal, State, Local not specified - PPO

## 2023-04-26 DIAGNOSIS — Z131 Encounter for screening for diabetes mellitus: Secondary | ICD-10-CM | POA: Diagnosis not present

## 2023-04-26 DIAGNOSIS — Z0000 Encounter for general adult medical examination without abnormal findings: Secondary | ICD-10-CM | POA: Diagnosis not present

## 2023-04-26 LAB — CMP14+EGFR

## 2023-04-26 LAB — CBC WITH DIFFERENTIAL/PLATELET
Immature Grans (Abs): 0 10*3/uL (ref 0.0–0.1)
Lymphs: 39 %
MCH: 28.2 pg (ref 26.6–33.0)
MCHC: 31.8 g/dL (ref 31.5–35.7)
RBC: 4.43 x10E6/uL (ref 3.77–5.28)
RDW: 12.7 % (ref 11.7–15.4)

## 2023-04-26 LAB — LIPID PANEL

## 2023-04-27 LAB — CBC WITH DIFFERENTIAL/PLATELET
Basophils Absolute: 0 10*3/uL (ref 0.0–0.2)
Basos: 1 %
EOS (ABSOLUTE): 0.1 10*3/uL (ref 0.0–0.4)
Eos: 1 %
Hematocrit: 39.3 % (ref 34.0–46.6)
Hemoglobin: 12.5 g/dL (ref 11.1–15.9)
Immature Granulocytes: 1 %
Lymphocytes Absolute: 2.2 10*3/uL (ref 0.7–3.1)
MCV: 89 fL (ref 79–97)
Monocytes Absolute: 0.4 10*3/uL (ref 0.1–0.9)
Monocytes: 7 %
Neutrophils Absolute: 2.9 10*3/uL (ref 1.4–7.0)
Neutrophils: 51 %
Platelets: 295 10*3/uL (ref 150–450)
WBC: 5.6 10*3/uL (ref 3.4–10.8)

## 2023-04-27 LAB — CMP14+EGFR
ALT: 18 IU/L (ref 0–32)
AST: 18 IU/L (ref 0–40)
Alkaline Phosphatase: 85 IU/L (ref 44–121)
CO2: 18 mmol/L — ABNORMAL LOW (ref 20–29)
Creatinine, Ser: 0.78 mg/dL (ref 0.57–1.00)
Globulin, Total: 3 g/dL (ref 1.5–4.5)
Glucose: 118 mg/dL — ABNORMAL HIGH (ref 70–99)
Sodium: 138 mmol/L (ref 134–144)
Total Protein: 7.2 g/dL (ref 6.0–8.5)
eGFR: 103 mL/min/{1.73_m2} (ref >59)

## 2023-04-27 LAB — LIPID PANEL
Chol/HDL Ratio: 3.1 ratio (ref 0.0–4.4)
Cholesterol, Total: 170 mg/dL (ref 100–199)
HDL: 55 mg/dL (ref >39)
LDL Chol Calc (NIH): 102 mg/dL — ABNORMAL HIGH (ref 0–99)
VLDL Cholesterol Cal: 13 mg/dL (ref 5–40)

## 2023-04-27 LAB — HEMOGLOBIN A1C
Est. average glucose Bld gHb Est-mCnc: 146 mg/dL
Hgb A1c MFr Bld: 6.7 % — ABNORMAL HIGH (ref 4.8–5.6)

## 2023-04-27 LAB — TSH: TSH: 2.46 u[IU]/mL (ref 0.450–4.500)

## 2023-04-27 NOTE — Progress Notes (Signed)
Results sent through MyChart

## 2023-04-29 DIAGNOSIS — E119 Type 2 diabetes mellitus without complications: Secondary | ICD-10-CM

## 2023-04-29 HISTORY — DX: Type 2 diabetes mellitus without complications: E11.9

## 2023-07-12 ENCOUNTER — Encounter: Payer: Self-pay | Admitting: Internal Medicine

## 2023-07-18 ENCOUNTER — Ambulatory Visit: Payer: Federal, State, Local not specified - PPO | Admitting: Internal Medicine

## 2023-08-01 ENCOUNTER — Ambulatory Visit: Payer: Self-pay | Admitting: Internal Medicine

## 2023-08-01 DIAGNOSIS — J309 Allergic rhinitis, unspecified: Secondary | ICD-10-CM

## 2024-01-02 ENCOUNTER — Ambulatory Visit: Payer: Federal, State, Local not specified - PPO | Admitting: Medical

## 2024-01-11 ENCOUNTER — Ambulatory Visit: Payer: Federal, State, Local not specified - PPO | Admitting: Medical

## 2024-01-18 ENCOUNTER — Ambulatory Visit: Payer: Federal, State, Local not specified - PPO | Admitting: Medical

## 2024-01-25 ENCOUNTER — Ambulatory Visit: Payer: Federal, State, Local not specified - PPO | Admitting: Medical

## 2024-01-25 VITALS — BP 110/66 | HR 92 | Wt 197.4 lb

## 2024-01-25 DIAGNOSIS — R635 Abnormal weight gain: Secondary | ICD-10-CM

## 2024-01-25 DIAGNOSIS — Z6837 Body mass index (BMI) 37.0-37.9, adult: Secondary | ICD-10-CM | POA: Diagnosis not present

## 2024-01-25 DIAGNOSIS — R739 Hyperglycemia, unspecified: Secondary | ICD-10-CM | POA: Diagnosis not present

## 2024-01-25 DIAGNOSIS — R0981 Nasal congestion: Secondary | ICD-10-CM

## 2024-01-25 DIAGNOSIS — J3489 Other specified disorders of nose and nasal sinuses: Secondary | ICD-10-CM | POA: Diagnosis not present

## 2024-01-25 NOTE — Patient Instructions (Addendum)
 Please call your insurance to see which of the following are covered by insurance:  Wegovy shot Zepbound shot Qsymia oral medication Contrave oral medication    Hyperglycemia-hemoglobin A1c repeat labs today. she was borderline diabetic on hemoglobin A1c back in May 2024 for her physical with an elevated blood sugar.  Diabetic criteria would be blood sugar fasting greater than 126 and hemoglobin A1c greater than 6.5%   BMI 37-discussed her concerns.  Glad to hear she is exercising and making dietary changes since her physical in May 2024.  Continue risk with exercise several days per week at least 4 days/week, continue healthy eating habits.  Avoid junk food, snack food, high carbs.  We discussed medication options for weight loss efforts.  She will check her insurance to see what medicines might be covered  Given her sinus pressure nasal congestion advise she begin over-the-counter allergy medication such as Zyrtec or Allegra at nighttime or nasal spray such as Flonase in the morning

## 2024-01-25 NOTE — Progress Notes (Signed)
 Subjective:  Tina Thornton is a 33 y.o. female who presents for Chief Complaint  Patient presents with   Consult    Discuss Weight loss, no concerns     Here for concerns about weight loss efforts.  She like to lose weight.  [facility]  Since 03/2023 physical she has been exercising, drinking more water, eating more greens.  Her hemoglobin A1c was in the borderline diabetes marker back in May 2024.  Exercise - uses a trainer, doing running, weight lifting, other cardio.   Does this 2 times per week, and uses treadmill daily.  Eats typical 2 meals daily, typically 10am and lunch, and some snacking .   No prior weight loss medications.  She has been feeling some pressure in the head, pressure in sinuses, stuffy in the right nostril.  Not using any medication for this  No other aggravating or relieving factors.    No other c/o.  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     No current facility-administered medications on file prior to visit.     The following portions of the patient's history were reviewed and updated as appropriate: allergies, current medications, past family history, past medical history, past social history, past surgical history and problem list.  ROS Otherwise as in subjective above    Objective: BP 110/66   Pulse 92   Wt 197 lb 6.4 oz (89.5 kg)   SpO2 98%   BMI 37.92 kg/m   Wt Readings from Last 3 Encounters:  01/25/24 197 lb 6.4 oz (89.5 kg)  04/19/23 192 lb 3.2 oz (87.2 kg)  04/18/23 193 lb 6.4 oz (87.7 kg)    General appearance: alert, no distress, well developed, well  nourished HEENT: normocephalic, sclerae anicteric, conjunctiva pink and moist, TMs pearly, right nare with turbinate edema and clear discharge, left nostril normal. pharynx normal Oral cavity: MMM, no lesions Neck: supple, no lymphadenopathy, no thyromegaly, no masses Heart: RRR, normal S1, S2, no murmurs Lungs: CTA bilaterally, no wheezes, rhonchi, or rales Pulses: 2+ radial pulses, 2+ pedal pulses, normal cap refill Ext: no edema   Assessment: Encounter Diagnoses  Name Primary?   Hyperglycemia Yes   Weight gain    BMI 37.0-37.9, adult    Sinus pressure    Nasal congestion      Plan: Hyperglycemia-hemoglobin A1c repeat labs today. she was borderline diabetic on hemoglobin A1c back in May 2024 for her physical with an elevated blood sugar.  BMI 37-discussed her concerns.  Glad to hear she is exercising and making dietary changes since her physical in May 2024.  Continue risk with exercise several days per week at least 4 days/week, continue healthy eating habits.  Avoid junk food, snack food, high carbs.  We discussed medication options for weight loss efforts.  She will check her insurance to see what medicines might be covered  Given her sinus pressure nasal congestion advise she begin over-the-counter allergy medication such as Zyrtec or Allegra at nighttime or nasal spray such as  Flonase in the morning  Cheree was seen today for consult.  Diagnoses and all orders for this visit:  Hyperglycemia -     Hemoglobin A1c  Weight gain  BMI 37.0-37.9, adult  Sinus pressure  Nasal congestion    Follow up: pending call back

## 2024-01-26 LAB — HEMOGLOBIN A1C
Est. average glucose Bld gHb Est-mCnc: 143 mg/dL
Hgb A1c MFr Bld: 6.6 % — ABNORMAL HIGH (ref 4.8–5.6)

## 2024-01-26 NOTE — Progress Notes (Signed)
 Results sent through MyChart

## 2024-01-30 ENCOUNTER — Other Ambulatory Visit: Payer: Self-pay | Admitting: Medical

## 2024-01-30 MED ORDER — MOUNJARO 2.5 MG/0.5ML ~~LOC~~ SOAJ
2.5000 mg | SUBCUTANEOUS | 0 refills | Status: DC
Start: 2024-01-30 — End: 2024-03-21

## 2024-01-30 MED ORDER — MOUNJARO 5 MG/0.5ML ~~LOC~~ SOAJ: 5.0000 mg | SUBCUTANEOUS | 0 refills | Status: DC

## 2024-01-31 ENCOUNTER — Other Ambulatory Visit (HOSPITAL_COMMUNITY): Payer: Self-pay

## 2024-01-31 ENCOUNTER — Other Ambulatory Visit: Payer: Self-pay | Admitting: Medical

## 2024-01-31 ENCOUNTER — Telehealth: Payer: Self-pay

## 2024-01-31 NOTE — Telephone Encounter (Signed)
 Pharmacy Patient Advocate Encounter   Received notification from CoverMyMeds that prior authorization for Tina Thornton 5MG /0.5ML auto-injectors is required/requested.   Insurance verification completed.   The patient is insured through [facility] .   Per test claim: PA required; PA submitted to above mentioned insurance via CoverMyMeds Key/confirmation #/EOC (Key: [id number]) Status is pending

## 2024-02-01 ENCOUNTER — Other Ambulatory Visit (HOSPITAL_COMMUNITY): Payer: Self-pay

## 2024-02-01 NOTE — Telephone Encounter (Signed)
 Pharmacy Patient Advocate Encounter  Received notification from [facility] that Prior Authorization for {Mounjaro 5MG /0.5ML auto-injectors has been APPROVED from 2.3.25 to 3.5.26. Ran test claim, Copay is $55.52. This test claim was processed through [facility]- copay amounts may vary at other pharmacies due to pharmacy/plan contracts, or as the patient moves through the different stages of their insurance plan.   PA #/Case ID/Reference #: (Key: [id number])

## 2024-03-21 ENCOUNTER — Ambulatory Visit: Admitting: Medical

## 2024-03-21 VITALS — BP 110/80 | HR 125 | Wt 181.8 lb

## 2024-03-21 DIAGNOSIS — R251 Tremor, unspecified: Secondary | ICD-10-CM

## 2024-03-21 DIAGNOSIS — E1165 Type 2 diabetes mellitus with hyperglycemia: Secondary | ICD-10-CM

## 2024-03-21 DIAGNOSIS — Z6834 Body mass index (BMI) 34.0-34.9, adult: Secondary | ICD-10-CM | POA: Diagnosis not present

## 2024-03-21 MED ORDER — MOUNJARO 5 MG/0.5ML ~~LOC~~ SOAJ: 5.0000 mg | SUBCUTANEOUS | 1 refills | Status: DC

## 2024-03-21 NOTE — Progress Notes (Signed)
 Subjective:  Tina Thornton is a 33 y.o. female who presents for Chief Complaint  Patient presents with   Follow-up    Follow-up on weight loss. Since starting on mounjaro- will get chills,coughing, and some chest pain. Has cut out all carbs     Here for follow-up on weight loss and diabetes.  At her visit in February 2025 her labs showed new onset diabetes and she had concerns about weight loss.  We initiated Mounjaro.  She has lost significant weight recently with the medication but has also had times where she is feels shivers or shakes.  She has no appetite on the medicine and some days she only eats once a day or barely anything at all.  Otherwise no other side effects reported to medication.  [name] getting some exercise and regular.  [name] not eating any junk food.  [name] sometimes only eating a protein shake in the morning.  Sometimes she will eat lunch or dinner but typically not the 1 or 2 meals a day max.  No weakness, lightheadedness, numbness, tingling, chest pain or shortness of breath.  [name] on her second week of Mounjaro to 5 mg.  She completed a month of the 2.5 mg Mounjaro.  No other aggravating or relieving factors.    No other c/o.  The following portions of the patient's history were reviewed and updated as appropriate: allergies, current medications, past family history, past medical history, past social history, past surgical history and problem list.  ROS Otherwise as in subjective above  Objective: BP 110/80   Pulse (!) 125   Wt 181 lb 12.8 oz (82.5 kg)   BMI 34.92 kg/m   General appearance: alert, no distress, well developed, well nourished HEENT: normocephalic, sclerae anicteric, conjunctiva pink and moist Oral cavity: MMM, no lesions Neck: supple, no lymphadenopathy, no thyromegaly, no masses Heart: RRR, normal S1, S2, no murmurs Lungs: CTA bilaterally, no wheezes, rhonchi, or rales Pulses: 2+ radial pulses, 2+ pedal pulses, normal cap refill Ext: no  edema   Assessment: Encounter Diagnoses  Name Primary?   Type 2 diabetes mellitus with hyperglycemia, without long-term current use of insulin (HCC) Yes   BMI 34.0-34.9,adult    Shaking      Plan: We discussed that she has to be eating 2-3 times daily to avoid hypoglycemia.  Counseled on diet, exercise, avoiding hypoglycemia.  she will continue the 5 mg Mounjaro for now instead of increasing the dose.  We discussed other potential side effects of the medication.  We discussed need to hydrate well throughout the day.  She has a glucometer at home.  [name] going to try to find and use that some to monitor blood sugars.  We also discussed overall safety, risk and benefits of Mounjaro.  Discussed also considering being on birth control.  [name] not currently sexually active.  Amarachukwu was seen today for follow-up.  Diagnoses and all orders for this visit:  Type 2 diabetes mellitus with hyperglycemia, without long-term current use of insulin (HCC)  BMI 34.0-34.9,adult  Shaking  Other orders -     tirzepatide [facility]) 5 MG/0.5ML Pen; Inject 5 mg into the skin once a week.    Follow up: 2 months

## 2024-05-02 ENCOUNTER — Ambulatory Visit: Admitting: Medical

## 2024-05-06 ENCOUNTER — Encounter: Payer: Self-pay | Admitting: Medical

## 2024-05-06 ENCOUNTER — Ambulatory Visit: Admitting: Medical

## 2024-05-06 VITALS — BP 106/64 | HR 95 | Ht 60.5 in | Wt 177.2 lb

## 2024-05-06 DIAGNOSIS — Z6834 Body mass index (BMI) 34.0-34.9, adult: Secondary | ICD-10-CM | POA: Diagnosis not present

## 2024-05-06 DIAGNOSIS — K921 Melena: Secondary | ICD-10-CM | POA: Diagnosis not present

## 2024-05-06 DIAGNOSIS — K5900 Constipation, unspecified: Secondary | ICD-10-CM

## 2024-05-06 DIAGNOSIS — E1165 Type 2 diabetes mellitus with hyperglycemia: Secondary | ICD-10-CM | POA: Diagnosis not present

## 2024-05-06 DIAGNOSIS — K644 Residual hemorrhoidal skin tags: Secondary | ICD-10-CM

## 2024-05-06 MED ORDER — TIRZEPATIDE 7.5 MG/0.5ML ~~LOC~~ SOAJ: 7.5000 mg | SUBCUTANEOUS | 2 refills | Status: DC

## 2024-05-06 MED ORDER — HYDROCORTISONE (PERIANAL) 2.5 % EX CREA: 1.0000 | TOPICAL_CREAM | Freq: Two times a day (BID) | CUTANEOUS | 0 refills | Status: DC

## 2024-05-06 NOTE — Progress Notes (Signed)
 Subjective:  Tina Thornton is a 33 y.o. female who presents for Chief Complaint  Patient presents with   Diabetes     f/up on DM, Monuj[place] has been working, she states that she has been eating more.      Here for follow-up on weight loss and diabetes.   Using Mounjaro 5mg  weekly.  Last visit she had a few times of dizziness, lightheaded.  Last visit we discussed hydration, not skipping meals, avoiding hypoglycemia.  Since last visit better with hydration, not skipping meals.Gets some nausea, some constipation.   She can seem to find her glucometer currently.     Having some constipation occasionally.  Also having some external hemorrhoids last week.  Needs refill on cream she has used in the past.  No other c/o.  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 2 mL 1   valACYclovir (VALTREX) 500 MG tablet Take 500 mg by mouth 2 (two) times daily.     No current facility-administered medications on file prior to visit.     The following portions of the patient's history were reviewed and updated as appropriate: allergies, current medications, past family history, past medical history, past social history, past surgical history and problem list.  ROS Otherwise as in subjective above     Objective: BP 106/64   Pulse 95   Ht 5' 0.5" (1.537 m)   Wt 177 lb 3.2 oz (80.4 kg)   LMP 04/19/2024   SpO2 98%   Breastfeeding No   BMI 34.04 kg/m   Wt Readings from Last 3 Encounters:  05/06/24 177 lb 3.2 oz (80.4 kg)  03/21/24 181 lb 12.8 oz (82.5 kg)  01/25/24 197 lb 6.4 oz (89.5 kg)    General  appearance: alert, no distress, well developed, well nourished     Assessment: Encounter Diagnoses  Name Primary?   Controlled type 2 diabetes mellitus with hyperglycemia, without long-term current use of insulin (HCC) Yes   Blood in stool    BMI 34.0-34.9,adult    Constipation, unspecified constipation type    External hemorrhoid       Plan: Counseled on diet, exercise.  Glad to hear she is doing better with hydration and not skipping meals.   We will increase to Mounjaro 7.5mg  weekly.   She doesn't want to go up after a month for now, worried about constipation and nausea.   [name] going to work on portion size, avoid overeating.   Can use miralax prn OTC for constipation.   She will call back in a month to let me know if she wants to go up to the 10mg  Mounjaro.   Hx/o external hemorrhoids - for flare up can use hot bath soaks, Anusol  cream as below.  BMI >34 - work on efforts to lose weight through healthy diet and exercise  constipation - advised good water intake, miralax prn   Tina Thornton was seen today for diabetes.  Diagnoses and all orders for this visit:  Controlled type  2 diabetes mellitus with hyperglycemia, without long-term current use of insulin (HCC)  Blood in stool -     hydrocortisone  (ANUSOL -HC) 2.5 % rectal cream; Place 1 Application rectally 2 (two) times daily.  BMI 34.0-34.9,adult  Constipation, unspecified constipation type  External hemorrhoid  Other orders -     tirzepatide (MOUNJARO) 7.5 MG/0.5ML Pen; Inject 7.5 mg into the skin once a week.    Follow up: 2-3 months for fasting well visit.

## 2024-08-01 ENCOUNTER — Encounter: Payer: Self-pay | Admitting: Medical

## 2024-08-01 ENCOUNTER — Ambulatory Visit: Admitting: Medical

## 2024-08-01 VITALS — BP 112/70 | HR 83 | Ht 60.5 in | Wt 173.0 lb

## 2024-08-01 DIAGNOSIS — E1165 Type 2 diabetes mellitus with hyperglycemia: Secondary | ICD-10-CM | POA: Diagnosis not present

## 2024-08-01 DIAGNOSIS — Z0000 Encounter for general adult medical examination without abnormal findings: Secondary | ICD-10-CM | POA: Diagnosis not present

## 2024-08-01 DIAGNOSIS — Z136 Encounter for screening for cardiovascular disorders: Secondary | ICD-10-CM

## 2024-08-01 DIAGNOSIS — Z13220 Encounter for screening for lipoid disorders: Secondary | ICD-10-CM

## 2024-08-01 DIAGNOSIS — Z7185 Encounter for immunization safety counseling: Secondary | ICD-10-CM | POA: Diagnosis not present

## 2024-08-01 LAB — LIPID PANEL

## 2024-08-01 NOTE — Progress Notes (Signed)
 Subjective:   HPI  Tina Thornton is a 33 y.o. female who presents for Chief Complaint  Patient presents with   Annual Exam    Fasting cpe, having headaches-had a really bad one yesterday. No vaccines today    Patient Care Team: Whitley Strycharz, Alm RAMAN, PA-C as PCP - General (Family Medicine) Ob/Gyn, [facility] (Obstetrics and Gynecology), [facility] Dr. Arleta Blanch, allergist Sees eye doctor  Sees dentist   Concerns: Diabetes - hasn't been checking as she can't find her glucometer.  Is compliant with Mounjaro  7.5 mg weekly.  She wants to stay on this dose instead of increasing the dose.  Use Valtrex as needed for history of viral infection   Reviewed their medical, surgical, family, social, medication, and allergy  history and updated chart as appropriate.  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 30 g 0   tirzepatide  (MOUNJARO ) 7.5 MG/0.5ML Pen Inject 7.5 mg into the skin once a week. 2 mL 2   valACYclovir (VALTREX) 500 MG tablet Take 500 mg by mouth 2 (two) times daily.     No current facility-administered medications on file prior to visit.      Current Outpatient Medications:    hydrocortisone  (ANUSOL -HC) 2.5 % rectal cream, Place 1 Application rectally 2 (two) times daily., Disp: 30 g, Rfl: 0   tirzepatide  (MOUNJARO ) 7.5 MG/0.5ML Pen, Inject 7.5 mg into the skin once a week., Disp: 2 mL, Rfl: 2   valACYclovir (VALTREX) 500 MG tablet, Take 500 mg by mouth 2 (two) times daily., Disp: , Rfl:   Family History  Problem Relation Age of Onset    Hypertension Mother    Other Sister        lupus   Arthritis Paternal Grandmother    Dementia Paternal Grandfather    Headache Neg Hx    Migraines Neg Hx    Heart disease Neg Hx    Stroke Neg Hx     Past Surgical History:  Procedure Laterality Date   DENTAL SURGERY       Review of Systems  Constitutional:  Negative for chills, fever, malaise/fatigue and weight loss.  HENT:  Negative for congestion, ear pain, hearing loss, sore throat and tinnitus.   Eyes:  Negative for blurred vision, pain and redness.  Respiratory:  Negative for cough, hemoptysis and shortness of breath.   Cardiovascular:  Negative for chest pain, palpitations, orthopnea, claudication and leg swelling.  Gastrointestinal:  Negative for abdominal pain, blood in stool, constipation, diarrhea, nausea and vomiting.  Genitourinary:  Negative for dysuria, flank pain, frequency, hematuria and urgency.  Musculoskeletal:  Negative for falls, joint pain and myalgias.  Skin:  Negative for itching and rash.  Neurological:  Negative for dizziness, tingling, speech change, weakness and headaches.  Endo/Heme/Allergies:  Negative for polydipsia. Does not bruise/bleed easily.  Psychiatric/Behavioral:  Negative for depression and memory loss. The patient is not nervous/anxious and does not have insomnia.        Objective:  BP 112/70   Pulse 83   Ht 5' 0.5 (1.537 m)   Wt 173 lb (78.5 kg)   LMP 07/17/2024   SpO2 100%   BMI 33.23 kg/m   Wt Readings from Last 3 Encounters:  08/01/24 173 lb (78.5 kg)  05/06/24 177 lb 3.2 oz (80.4 kg)  03/21/24 181 lb 12.8 oz (82.5 kg)   BP Readings from Last 3 Encounters:  08/01/24 112/70  05/06/24 106/64  03/21/24 110/80    General appearance: alert, no distress, WD/WN Skin: unremarkable HEENT: normocephalic, conjunctiva/corneas normal, sclerae anicteric, PERRLA, EOMi, nares patent, no discharge or erythema, pharynx normal Oral cavity: MMM, tongue normal, teeth normal Neck:  supple, no lymphadenopathy, no thyromegaly, no masses, normal ROM, no bruits Chest: non tender, normal shape and expansion Heart: RRR, normal S1, S2, no murmurs Lungs: CTA bilaterally, no wheezes, rhonchi, or rales Abdomen: +bs, soft, non tender, non distended, no masses, no hepatomegaly, no splenomegaly, no bruits Back: non tender, normal ROM, no scoliosis Musculoskeletal: upper extremities non tender, no obvious deformity, normal ROM throughout, lower extremities non tender, no obvious deformity, normal ROM throughout Extremities: no edema, no cyanosis, no clubbing Pulses: 2+ symmetric, upper and lower extremities, normal cap refill Neurological: alert, oriented x 3, CN2-12 intact, strength normal upper extremities and lower extremities, sensation normal throughout, DTRs 2+ throughout, no cerebellar signs, gait normal Psychiatric: normal affect, behavior normal, pleasant  Breast/gyn/rectal - deferred to gynecology     Assessment and Plan :   Encounter Diagnoses  Name Primary?   Encounter for health maintenance examination in adult Yes   Vaccine counseling    Type 2 diabetes mellitus with hyperglycemia, without long-term current use of insulin (HCC)    Encounter for lipid screening for cardiovascular disease     This visit was a preventative care visit, also known as wellness visit or routine physical.   Topics typically include healthy lifestyle, diet, exercise, preventative care, vaccinations, sick and well care, proper use of emergency dept and after hours care, as well as other concerns.    Separate significant issues discussed: Diabetes type 2-advised glucose testing at least once a week.  Glucometer given.  Continue Mounjaro  7.5 mg weekly.  She does not want to increase the dose at this time.  See eye doctor for yearly diabetic eye exam  Consider statin for heart disease prevention and standard of care for diabetes   General Recommendations: Continue to return yearly for  your annual wellness and preventative care visits.  This gives us  a chance to discuss healthy lifestyle, exercise, vaccinations, review your chart record, and perform screenings where appropriate.  I recommend you see your eye doctor yearly for routine vision care.  I recommend you see your dentist yearly for routine dental care including hygiene visits twice yearly.   Vaccination recommendations were reviewed Immunization History  Administered Date(s) Administered   Moderna Sars-Covid-2 Vaccination 03/22/2020, 04/19/2020   Tdap 05/08/2020    Vaccine recommendations: Consider yearly flu shot Consider pneumococcal vaccine  Declines both  Screening for cancer: Colon cancer screening: Age 89  Breast cancer screening: You should perform a self breast exam monthly.   We reviewed recommendations for regular mammograms and breast cancer screening.  Cervical cancer screening: We reviewed recommendations for pap smear screening. Last pap - will request recent pap from gyn   Skin cancer screening: Check your skin regularly for new changes, growing lesions, or other lesions of concern Come in for evaluation if you have skin lesions of concern.  Lung cancer screening: If you have a greater than 20 pack year history of tobacco use, then you may qualify for lung cancer screening with a chest CT scan.   Please call your insurance company to inquire about coverage for this test.  Pancreatic cancer: no current screening test is available routinely recommended.  (Risk factors: Smoking, overweight or obese, diabetes, chronic pancreatitis, work Nurse, mental health, Solicitor, 27 year old or greater, female greater than female, African-American, family history of pancreatic cancer, hereditary breast, ovarian, melanoma, Lynch, Peutz-jeghers).  We currently don't have screenings for other cancers besides breast, cervical, colon, and lung cancers.  If you have a strong family history of cancer or  have other cancer screening concerns, please let me know.    Bone health: Get at least 150 minutes of aerobic exercise weekly Get weight bearing exercise at least once weekly Bone density test:  A bone density test is an imaging test that uses a type of X-ray to measure the amount of calcium and other minerals in your bones. The test may be used to diagnose or screen you for a condition that causes weak or thin bones (osteoporosis), predict your risk for a broken bone (fracture), or determine how well your osteoporosis treatment is working. The bone density test is recommended for females 65 and older, or females or males <65 if certain risk factors such as thyroid  disease, long term use of steroids such as for asthma or rheumatological issues, vitamin D deficiency, estrogen deficiency, family history of osteoporosis, self or family history of fragility fracture in first degree relative.    Heart health: Get at least 150 minutes of aerobic exercise weekly Limit alcohol It is important to maintain a healthy blood pressure and healthy cholesterol numbers  Heart disease screening: Screening for heart disease includes screening for blood pressure, fasting lipids, glucose/diabetes screening, BMI height to weight ratio, reviewed of smoking status, physical activity, and diet.    Goals include blood pressure 120/80 or less, maintaining a healthy lipid/cholesterol profile, preventing diabetes or keeping diabetes numbers under good control, not smoking or using tobacco products, exercising most days per week or at least 150 minutes per week of exercise, and eating healthy variety of fruits and vegetables, healthy oils, and avoiding unhealthy food choices like fried food, fast food, high sugar and high cholesterol foods.    Other tests may possibly include EKG test, CT coronary calcium score, echocardiogram, exercise treadmill stress test.    Vascular disease screening: For high risk individuals  including smokers, diabetes, patients with known heart disease or high blood pressure, kidney disease, and others, screening for vascular disease or atherosclerosis of the arteries is available.  Examples may include carotid ultrasound, abdominal aortic ultrasound, ABI blood flow screening in the legs, thoracic aorta screening.   Medical care options: I recommend you continue to seek care here first for routine care.  We try really hard to have available appointments Monday through Friday daytime hours for sick visits, acute visits, and physicals.  Urgent care should be used for after hours and weekends for significant issues that cannot wait till the next day.  The emergency department should be used for significant potentially life-threatening emergencies.  The emergency department is expensive, can often have long wait times for less significant concerns, so try to utilize primary care, urgent care, or telemedicine when possible to avoid unnecessary trips to the emergency department.  Virtual visits and telemedicine have been introduced since the pandemic started in 2020, and can be convenient ways to  receive medical care.  We offer virtual appointments as well to assist you in a variety of options to seek medical care.   Legal Take the time to do a Last Will and Testament, advanced directives including Healthcare Power of Attorney and Living Will documents.  Do not leave your family with burdens that can be handled ahead of time.   Advanced Directives: I recommend you consider completing a Health Care Power of Attorney and Living Will.   These documents respect your wishes and help alleviate burdens on your loved ones if you were to become terminally ill or be in a position to need those documents enforced.    You can complete Advanced Directives yourself, have them notarized, then have copies made for our office, for you and for anybody you feel should have them in safe keeping.  Or, you can  have an attorney prepare these documents.   If you haven't updated your Last Will and Testament in a while, it may be worthwhile having an attorney prepare these documents together and save on some costs.       Tava was seen today for annual exam.  Diagnoses and all orders for this visit:  Encounter for health maintenance examination in adult -     CBC -     Comprehensive metabolic panel with GFR -     Lipid panel -     TSH -     Hemoglobin A1c -     Microalbumin/Creatinine Ratio, Urine  Vaccine counseling  Type 2 diabetes mellitus with hyperglycemia, without long-term current use of insulin (HCC) -     TSH -     Hemoglobin A1c -     Microalbumin/Creatinine Ratio, Urine  Encounter for lipid screening for cardiovascular disease -     Lipid panel    Follow-up pending labs, yearly for physical

## 2024-08-02 ENCOUNTER — Encounter: Payer: Self-pay | Admitting: Medical

## 2024-08-02 ENCOUNTER — Ambulatory Visit: Payer: Self-pay | Admitting: Medical

## 2024-08-02 LAB — MICROALBUMIN / CREATININE URINE RATIO
Creatinine, Urine: 134.9 mg/dL
Microalb/Creat Ratio: <2 mg/g{creat} (ref 0–29)
Microalbumin, Urine: <3 ug/mL

## 2024-08-02 LAB — COMPREHENSIVE METABOLIC PANEL WITH GFR
ALT: 12 IU/L (ref 0–32)
AST: 14 IU/L (ref 0–40)
Albumin: 3.9 g/dL (ref 3.9–4.9)
Alkaline Phosphatase: 70 IU/L (ref 44–121)
BUN/Creatinine Ratio: 11 (ref 9–23)
BUN: 9 mg/dL (ref 6–20)
Bilirubin Total: 0.5 mg/dL (ref 0.0–1.2)
CO2: 19 mmol/L — AB (ref 20–29)
Calcium: 9.1 mg/dL (ref 8.7–10.2)
Chloride: 107 mmol/L — AB (ref 96–106)
Creatinine, Ser: 0.82 mg/dL (ref 0.57–1.00)
Globulin, Total: 3 g/dL (ref 1.5–4.5)
Glucose: 80 mg/dL (ref 70–99)
Potassium: 4.4 mmol/L (ref 3.5–5.2)
Sodium: 139 mmol/L (ref 134–144)
Total Protein: 6.9 g/dL (ref 6.0–8.5)
eGFR: 96 mL/min/1.73 (ref >59)

## 2024-08-02 LAB — CBC
Hematocrit: 39.9 % (ref 34.0–46.6)
Hemoglobin: 12.3 g/dL (ref 11.1–15.9)
MCH: 27.6 pg (ref 26.6–33.0)
MCHC: 30.8 g/dL — ABNORMAL LOW (ref 31.5–35.7)
MCV: 90 fL (ref 79–97)
Platelets: 262 x10E3/uL (ref 150–450)
RBC: 4.46 x10E6/uL (ref 3.77–5.28)
RDW: 12.5 % (ref 11.7–15.4)
WBC: 4.6 x10E3/uL (ref 3.4–10.8)

## 2024-08-02 LAB — HEMOGLOBIN A1C
Est. average glucose Bld gHb Est-mCnc: 108 mg/dL
Hgb A1c MFr Bld: 5.4 % (ref 4.8–5.6)

## 2024-08-02 LAB — TSH: TSH: 1.74 u[IU]/mL (ref 0.450–4.500)

## 2024-08-02 LAB — LIPID PANEL
Cholesterol, Total: 159 mg/dL (ref 100–199)
HDL: 48 mg/dL (ref >39)
LDL CALC COMMENT:: 3.3 ratio (ref 0.0–4.4)
LDL Chol Calc (NIH): 98 mg/dL (ref 0–99)
Triglycerides: 67 mg/dL (ref 0–149)
VLDL Cholesterol Cal: 13 mg/dL (ref 5–40)

## 2024-08-02 NOTE — Progress Notes (Signed)
 Results through MyChart

## 2024-08-04 NOTE — Progress Notes (Signed)
 Results thru my chart

## 2024-08-15 ENCOUNTER — Other Ambulatory Visit: Payer: Self-pay | Admitting: Medical

## 2024-08-15 ENCOUNTER — Ambulatory Visit: Payer: Self-pay | Admitting: Medical

## 2024-08-15 LAB — HM DIABETES EYE EXAM

## 2024-08-15 NOTE — Progress Notes (Signed)
 Abstract diabetic eye exam

## 2024-11-22 ENCOUNTER — Ambulatory Visit: Payer: Self-pay | Admitting: Medical

## 2024-11-22 VITALS — BP 110/60 | HR 72 | Ht 60.0 in | Wt 164.2 lb

## 2024-11-22 DIAGNOSIS — Z6832 Body mass index (BMI) 32.0-32.9, adult: Secondary | ICD-10-CM

## 2024-11-22 DIAGNOSIS — K644 Residual hemorrhoidal skin tags: Secondary | ICD-10-CM | POA: Diagnosis not present

## 2024-11-22 DIAGNOSIS — E1165 Type 2 diabetes mellitus with hyperglycemia: Secondary | ICD-10-CM

## 2024-11-22 DIAGNOSIS — R195 Other fecal abnormalities: Secondary | ICD-10-CM

## 2024-11-22 DIAGNOSIS — K921 Melena: Secondary | ICD-10-CM

## 2024-11-22 LAB — POCT GLYCOSYLATED HEMOGLOBIN (HGB A1C): Hemoglobin A1C: 4.9 % (ref 4.0–5.6)

## 2024-11-22 MED ORDER — LANCET DEVICE MISC: 1.0000 | 0 refills | Status: AC

## 2024-11-22 MED ORDER — LANCETS MISC: 1.0000 | 0 refills | Status: AC

## 2024-11-22 MED ORDER — TIRZEPATIDE 5 MG/0.5ML ~~LOC~~ SOAJ: 5.0000 mg | SUBCUTANEOUS | 5 refills | Status: AC

## 2024-11-22 MED ORDER — BLOOD GLUCOSE MONITORING SUPPL DEVI: 1.0000 | 3 refills | Status: AC

## 2024-11-22 MED ORDER — HYDROCORTISONE (PERIANAL) 2.5 % EX CREA: 1.0000 | TOPICAL_CREAM | Freq: Two times a day (BID) | CUTANEOUS | 1 refills | Status: AC

## 2024-11-22 MED ORDER — BLOOD GLUCOSE TEST VI STRP: 1.0000 | ORAL_STRIP | 0 refills | Status: AC

## 2024-11-22 NOTE — Progress Notes (Signed)
 "  Name: Tina Thornton   Date of Visit: 11/22/2024   Date of last visit with me: 08/15/2024   CHIEF COMPLAINT:  Chief Complaint  Patient presents with   Diabetes    Patient is here for follow up. Patient cannot find rectal cream, asking for refill.        HPI:  Discussed the use of AI scribe software for clinical note transcription with the patient, who gave verbal consent to proceed.  History of Present Illness  Tina Thornton is a 33 year old female with diabetes who presents for a medication check.  [name] currently taking Mounjaro  7.5 mg weekly and Valtrex as needed. [name] not checking her blood sugars currently as she lost the paper with instructions from last visit, doesn't have a glucometer. She feels okay overall and exercises regularly, although her job at the [employer] has been physically demanding, especially during the busy December period.  She experiences diarrhea on the second day after taking her Mounjaro  injection, which resolves after one day. She also experiences hemorrhoids approximately once or twice a year, which she manages with prescription cream, although she currently does not have a refill. She does not use hot bath soaks for relief.  Her dietary routine includes having a substantial lunch and smaller meals for breakfast and dinner. She avoids eating after 7:30 or 8:00 PM and drinks water and orange juice regularly.  No nausea or heartburn reported. She reports experiencing diarrhea on the second day after her Mounjaro  injection, which resolves after one day. She does not have any current interest in receiving a flu shot or pneumococcal vaccine.  No other aggravating or relieving factors. No other complaint.  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   Vaginal Pap smear, abnormal     ASCUS HPV+   Medications Ordered Prior to Encounter[1]  Results Labs HbA1c (07/2024): 4.9 decreased from 6.7 on 10/2023   Objective: BP 110/60   Pulse 72   Ht 5' (1.524 m)   Wt 164 lb 3.2 oz (74.5 kg)   LMP 11/21/2024 (Exact Date)   BMI 32.07 kg/m   General appearence: alert, no distress, WD/WN,  HEENT: normocephalic, sclerae anicteric, TMs pearly, nares patent, no discharge or erythema, pharynx normal Oral cavity: MMM, no lesions Neck: supple, no lymphadenopathy, no thyromegaly, no masses Heart: RRR, normal S1, S2, no murmurs Lungs: CTA bilaterally, no wheezes, rhonchi, or rales Pulses: 2+ symmetric, upper and lower extremities, normal cap refill    Diabetic Foot Exam - Simple   Simple Foot Form Diabetic Foot exam was performed with the following findings: Yes 11/22/2024 10:54 AM  Visual Inspection No deformities, no ulcerations, no other skin breakdown bilaterally: Yes Sensation Testing Intact to touch and monofilament testing bilaterally: Yes Pulse Check Posterior Tibialis and Dorsalis pulse intact bilaterally: Yes Comments Tattoo dorsal left foot     Assessment and Plan Encounter Diagnoses  Name Primary?   Type 2 diabetes mellitus with hyperglycemia, without long-term current use of insulin (HCC) Yes   External hemorrhoids    Loose stools    BMI 32.0-32.9,adult    Blood in stool     Type 2 diabetes mellitus Well-controlled with A1c of 4.9. Risk of hypoglycemia due to medication  dose and weight loss. - Reduced Mounjaro  to 5 mg weekly to prevent hypoglycemia. - Prescribed glucometer, test strips, and lancets for home blood glucose monitoring. - Instructed to check blood glucose fasting in the morning and before meals, aiming for levels between 70-100 mg/dL. - Advised to consume orange juice or eat if blood glucose is below 70 mg/dL. - Scheduled follow-up in 4-6 months, with earlier follow-up if symptoms of hypoglycemia occur.  Obesity BMI of 32.  Weight loss noted, currently 164 pounds. Motivated to continue weight loss. - Encouraged continued physical activity and healthy eating habits.  Hemorrhoids -advised hot bath soaks when symtpoms, hydrocortisone  cream prn -discussed prevention  General Health Maintenance Not up to date on flu and pneumococcal vaccines. Recommended due to diabetes. - Recommended annual flu vaccine. - Recommended one-time pneumococcal vaccine.  Tina Thornton was seen today for diabetes.  Diagnoses and all orders for this visit:  Type 2 diabetes mellitus with hyperglycemia, without long-term current use of insulin (HCC) -     HgB A1c  External hemorrhoids  Loose stools  BMI 32.0-32.9,adult  Blood in stool Comments: error Orders: -     hydrocortisone  (ANUSOL -HC) 2.5 % rectal cream; Place 1 Application rectally 2 (two) times daily.  Other orders -     tirzepatide  (MOUNJARO ) 5 MG/0.5ML Pen; Inject 5 mg into the skin once a week. -     Blood Glucose Monitoring Suppl DEVI; 1 each by Does not apply route as directed. Dispense based on patient and insurance preference. Use up to four times daily as directed. (FOR ICD-10 E10.9, E11.9). -     Glucose Blood (BLOOD GLUCOSE TEST STRIPS) STRP; 1 each by Does not apply route as directed. Dispense based on patient and insurance preference. Use up to four times daily as directed. (FOR ICD-10 E10.9, E11.9). -     Lancet Device MISC; 1 each by Does not apply route as directed. Dispense based on patient and insurance preference. Use up to four times daily as directed. (FOR ICD-10 E10.9, E11.9). -     Lancets MISC; 1 each by Does not apply route as directed. Dispense based on patient and insurance preference. Use up to four times daily as directed. (FOR ICD-10 E10.9, E11.9).    F/u prn       [1]  Current Outpatient Medications on File Prior to Visit  Medication Sig Dispense Refill   MOUNJARO  7.5 MG/0.5ML Pen ADMINISTER 7.5 MG UNDER THE SKIN 1 TIME A WEEK 2 mL 2    valACYclovir (VALTREX) 500 MG tablet Take 500 mg by mouth 2 (two) times daily.     No current facility-administered medications on file prior to visit.   "

## 2024-12-05 ENCOUNTER — Ambulatory Visit: Payer: Self-pay | Admitting: Medical

## 2025-05-01 ENCOUNTER — Ambulatory Visit: Admitting: Medical

## 2025-08-01 ENCOUNTER — Encounter: Admitting: Medical

## 2025-08-15 ENCOUNTER — Encounter: Admitting: Medical
